# Patient Record
Sex: Female | Born: 1955 | Race: White | Hispanic: No | Marital: Married | Smoking: Former smoker
Health system: Southern US, Community
[De-identification: ages and names within clinical notes are randomized; demographics above are authoritative.]

## PROBLEM LIST (undated history)

## (undated) DIAGNOSIS — M1990 Unspecified osteoarthritis, unspecified site: Secondary | ICD-10-CM

## (undated) DIAGNOSIS — F419 Anxiety disorder, unspecified: Secondary | ICD-10-CM

## (undated) DIAGNOSIS — E785 Hyperlipidemia, unspecified: Secondary | ICD-10-CM

## (undated) DIAGNOSIS — J984 Other disorders of lung: Secondary | ICD-10-CM

## (undated) DIAGNOSIS — F32A Depression, unspecified: Secondary | ICD-10-CM

## (undated) DIAGNOSIS — N6452 Nipple discharge: Secondary | ICD-10-CM

## (undated) DIAGNOSIS — K219 Gastro-esophageal reflux disease without esophagitis: Secondary | ICD-10-CM

## (undated) DIAGNOSIS — F329 Major depressive disorder, single episode, unspecified: Secondary | ICD-10-CM

## (undated) HISTORY — DX: Gastro-esophageal reflux disease without esophagitis: K21.9

## (undated) HISTORY — PX: REPLACEMENT TOTAL KNEE: SUR1224

## (undated) HISTORY — DX: Nipple discharge: N64.52

## (undated) HISTORY — DX: Hyperlipidemia, unspecified: E78.5

## (undated) HISTORY — DX: Depression, unspecified: F32.A

## (undated) HISTORY — DX: Other disorders of lung: J98.4

## (undated) HISTORY — DX: Anxiety disorder, unspecified: F41.9

## (undated) HISTORY — PX: FRACTURE SURGERY: SHX138

## (undated) HISTORY — DX: Unspecified osteoarthritis, unspecified site: M19.90

## (undated) HISTORY — PX: SHOULDER ARTHROSCOPY: SHX128

## (undated) HISTORY — PX: BREAST BIOPSY: SHX20

## (undated) HISTORY — PX: OTHER SURGICAL HISTORY: SHX169

## (undated) HISTORY — PX: TONSILLECTOMY: SUR1361

## (undated) HISTORY — PX: APPENDECTOMY (OPEN): SHX54

## (undated) HISTORY — PX: APPENDECTOMY: SHX54

---

## 2014-08-15 ENCOUNTER — Ambulatory Visit (INDEPENDENT_AMBULATORY_CARE_PROVIDER_SITE_OTHER): Payer: Commercial Managed Care - POS | Admitting: Family

## 2014-08-15 ENCOUNTER — Encounter (INDEPENDENT_AMBULATORY_CARE_PROVIDER_SITE_OTHER): Payer: Self-pay | Admitting: Family

## 2014-08-15 VITALS — BP 124/76 | HR 65 | Temp 96.9°F | Resp 16 | Ht 64.25 in | Wt 206.6 lb

## 2014-08-15 DIAGNOSIS — M797 Fibromyalgia: Secondary | ICD-10-CM

## 2014-08-15 DIAGNOSIS — F329 Major depressive disorder, single episode, unspecified: Secondary | ICD-10-CM

## 2014-08-15 DIAGNOSIS — E559 Vitamin D deficiency, unspecified: Secondary | ICD-10-CM | POA: Insufficient documentation

## 2014-08-15 DIAGNOSIS — F32A Depression, unspecified: Secondary | ICD-10-CM | POA: Insufficient documentation

## 2014-08-15 DIAGNOSIS — E538 Deficiency of other specified B group vitamins: Secondary | ICD-10-CM

## 2014-08-15 DIAGNOSIS — E785 Hyperlipidemia, unspecified: Secondary | ICD-10-CM

## 2014-08-15 MED ORDER — PREGABALIN 100 MG PO CAPS
100.00 mg | ORAL_CAPSULE | Freq: Two times a day (BID) | ORAL | Status: DC
Start: ? — End: 2014-08-15

## 2014-08-15 NOTE — Progress Notes (Signed)
Subjective:    Patient ID: Alicia Barry is a 58 y.o. female.    HPI Comments: Alicia Barry is in today to establish herself with this practice. She moved to [facility] within the past 2 years but has been traveling to [place] for routine medical checkups.    Alicia Barry has a diagnosis of depression which she states is fairly well controlled. She sees a psychiatrist, Dr. Zollie Scale, in [place]. She has recently developed some lower back and left hip pain and sees Dr. Ronalee Red, orthopedist, in [place], [place]. She recently received steroid injections in her spine and hip which did provide some relief. She was given an order for physical therapy but has not scheduled that yet.    Alicia Barry reports a history of fibromyalgia which has been well controlled with Lyrica 100 mg. She takes this once, sometimes twice, daily. She does need a refill of this medication.    She is brought in image of her most recent blood work which was done in August, 2015. She had a Pap smear done which was normal. Cholesterol was fairly well controlled with a slight elevation in LDL. Vitamin D was low but she states that she has started taking vitamin D3 5000 IUs daily. Vitamin B12 was less than 400 but was not advised to supplement. Other labs were within normal limits, including the TSH and free T4. Free T3 was not checked.          Review of Systems   Constitutional: Negative.    Respiratory: Negative.    Cardiovascular: Negative.    Gastrointestinal: Negative.         History of hiatal hernia and reflux. Symptoms are controlled with Protonix. She avoids NSAIDs because of stomach upset and heartburn.   Genitourinary:        Post menopause.   Musculoskeletal: Positive for back pain.   Neurological: Negative.    Psychiatric/Behavioral: Negative for sleep disturbance and dysphoric mood.           Objective:    Physical Exam   Constitutional: She is oriented to person, place, and time. She appears well-developed.    Obese.   HENT:   Head: Normocephalic and atraumatic.   Eyes: Pupils are equal, round, and reactive to light.   Neck: Normal range of motion. Neck supple. No thyromegaly present.   Cardiovascular: Normal rate, regular rhythm and normal heart sounds.    No murmur heard.  Pulmonary/Chest: Effort normal and breath sounds normal.   Musculoskeletal: Normal range of motion.   Lymphadenopathy:     She has no cervical adenopathy.   Neurological: She is alert and oriented to person, place, and time.   Skin: Skin is warm and dry.   Psychiatric: She has a normal mood and affect.   Vitals reviewed.          Assessment:       1. Hyperlipidemia, unspecified hyperlipidemia     2. Depression     3. Fibromyalgia     4. Vitamin D deficiency     5. Vitamin B12 deficiency               Plan:       As previously stated, previous lab work drawn in August 2015 was reviewed with the patient. In addition to the vitamin D that she is currently taking, I recommend that she also add vitamin B12 1000 g daily.    I recommend that blood work be rechecked in one month.  Lyrica 100 mg twice a day refilled.

## 2014-09-06 ENCOUNTER — Ambulatory Visit (INDEPENDENT_AMBULATORY_CARE_PROVIDER_SITE_OTHER): Payer: Commercial Managed Care - POS | Admitting: Family

## 2014-09-06 ENCOUNTER — Encounter (INDEPENDENT_AMBULATORY_CARE_PROVIDER_SITE_OTHER): Payer: Self-pay | Admitting: Family

## 2014-09-06 VITALS — BP 151/82 | HR 66 | Temp 98.6°F | Resp 16 | Ht 64.25 in | Wt 207.4 lb

## 2014-09-06 DIAGNOSIS — J320 Chronic maxillary sinusitis: Secondary | ICD-10-CM

## 2014-09-06 MED ORDER — FLUTICASONE PROPIONATE 50 MCG/ACT NA SUSP
NASAL | Status: DC
Start: ? — End: 2014-09-06

## 2014-09-06 MED ORDER — AMOXICILLIN-POT CLAVULANATE 875-125 MG PO TABS
1.0000 | ORAL_TABLET | Freq: Two times a day (BID) | ORAL | Status: DC
Start: ? — End: 2014-09-06

## 2014-09-06 NOTE — Progress Notes (Signed)
Subjective:    Patient ID: Alicia Barry is a 59 y.o. female.    HPI Comments: Alicia Barry is in today with complaints of headache and congestion which has been going on for about 5 days. She hasn't had much drainage but her throat is irritated. She also has not had a cough. She has been taking over-the-counter Mucinex Max and large doses of vitamin C. She denies ear pain. She has a grandchild that is scheduled to be born next week and wants to make sure that she is not contagious.      Review of Systems   Constitutional: Negative.    HENT: Positive for congestion, postnasal drip, sinus pressure and sore throat.    Respiratory: Negative for cough and shortness of breath.    Neurological: Positive for headaches.           Objective:    Physical Exam   Constitutional: She is oriented to person, place, and time. She appears well-developed. No distress.   Obese.   HENT:   Head: Normocephalic and atraumatic.   Right Ear: External ear and ear canal normal. Tympanic membrane is scarred.   Left Ear: Tympanic membrane, external ear and ear canal normal.   Nose: Mucosal edema present. Right sinus exhibits maxillary sinus tenderness and frontal sinus tenderness. Left sinus exhibits maxillary sinus tenderness and frontal sinus tenderness.   Mouth/Throat: Uvula is midline and mucous membranes are normal. Posterior oropharyngeal erythema present.   Eyes: Pupils are equal, round, and reactive to light.   Neck: Normal range of motion. Neck supple. No thyromegaly present.   Cardiovascular: Normal rate, regular rhythm and normal heart sounds.    No murmur heard.  Pulmonary/Chest: Effort normal and breath sounds normal.   Musculoskeletal: Normal range of motion.   Lymphadenopathy:     She has no cervical adenopathy.   Neurological: She is alert and oriented to person, place, and time.   Skin: Skin is warm and dry.   Psychiatric: She has a normal mood and affect.   Vitals reviewed.          Assessment:       1. Maxillary sinusitis,  unspecified chronicity          Plan:       Rx: Flonase nasal spray 2 sprays in each nostril once or twice daily.    If symptoms worsen or do not significantly improve, Rx: Augmentin 875 mg twice a day with food.  The symptoms would include sick yellow or green sinus drainage or sputum production with the cough or worsening sore throat.    Continue over-the-counter supplements and medications.    Follow-up if needed.

## 2014-10-09 ENCOUNTER — Ambulatory Visit (INDEPENDENT_AMBULATORY_CARE_PROVIDER_SITE_OTHER): Payer: Commercial Managed Care - POS | Admitting: Family

## 2014-10-09 ENCOUNTER — Encounter (INDEPENDENT_AMBULATORY_CARE_PROVIDER_SITE_OTHER): Payer: Self-pay | Admitting: Family

## 2014-10-09 VITALS — BP 113/68 | HR 73 | Temp 97.5°F | Resp 16 | Ht 64.25 in | Wt 205.2 lb

## 2014-10-09 DIAGNOSIS — J019 Acute sinusitis, unspecified: Secondary | ICD-10-CM

## 2014-10-09 MED ORDER — AMOXICILLIN-POT CLAVULANATE 875-125 MG PO TABS
1.0000 | ORAL_TABLET | Freq: Two times a day (BID) | ORAL | Status: DC
Start: ? — End: 2014-10-09

## 2014-10-09 NOTE — Progress Notes (Signed)
Subjective:    Patient ID: Alicia Barry is a 59 y.o. female.    URI   This is a new problem. The current episode started in the past 7 days. There has been no fever. Associated symptoms include congestion, coughing, rhinorrhea and a sore throat. Pertinent negatives include no wheezing.     Was exposed to grandson who tested positive for Strep.      Review of Systems   Constitutional: Negative.    HENT: Positive for congestion, rhinorrhea and sore throat.         Sinus D/C is yellow.   Respiratory: Positive for cough and chest tightness. Negative for shortness of breath and wheezing.            Objective:    Physical Exam   Constitutional: She appears well-developed. No distress.   Overweight, weight is down 2#.   HENT:   Head: Normocephalic and atraumatic.   Right Ear: Tympanic membrane, external ear and ear canal normal.   Left Ear: Tympanic membrane, external ear and ear canal normal.   Nose: Mucosal edema and rhinorrhea present.   Mouth/Throat: Uvula is midline. Posterior oropharyngeal erythema present.   Vitals reviewed.          Assessment:       1. Acute sinusitis, recurrence not specified, unspecified location          Plan:       Rx: Augmentin 875mg  BID with food.    Continue flonase.    Maintain good hydration.

## 2015-01-06 ENCOUNTER — Other Ambulatory Visit (INDEPENDENT_AMBULATORY_CARE_PROVIDER_SITE_OTHER): Payer: Self-pay | Admitting: Family

## 2015-01-06 ENCOUNTER — Ambulatory Visit
Admission: RE | Admit: 2015-01-06 | Discharge: 2015-01-06 | Disposition: A | Discharge status: Home / Self Care | Payer: Commercial Managed Care - POS | Source: Ambulatory Visit | Attending: Family | Admitting: Family

## 2015-01-06 ENCOUNTER — Encounter (INDEPENDENT_AMBULATORY_CARE_PROVIDER_SITE_OTHER): Payer: Self-pay | Admitting: Family

## 2015-01-06 ENCOUNTER — Ambulatory Visit (INDEPENDENT_AMBULATORY_CARE_PROVIDER_SITE_OTHER): Payer: Commercial Managed Care - POS | Admitting: Family

## 2015-01-06 VITALS — BP 129/60 | HR 82 | Temp 97.3°F | Resp 16 | Ht 64.25 in | Wt 204.4 lb

## 2015-01-06 DIAGNOSIS — M79671 Pain in right foot: Secondary | ICD-10-CM

## 2015-01-06 DIAGNOSIS — M25774 Osteophyte, right foot: Secondary | ICD-10-CM | POA: Insufficient documentation

## 2015-01-06 DIAGNOSIS — R937 Abnormal findings on diagnostic imaging of other parts of musculoskeletal system: Secondary | ICD-10-CM | POA: Insufficient documentation

## 2015-01-06 DIAGNOSIS — M7731 Calcaneal spur, right foot: Secondary | ICD-10-CM | POA: Insufficient documentation

## 2015-01-06 MED ORDER — HYDROCODONE-ACETAMINOPHEN 5-325 MG PO TABS
2.0000 | ORAL_TABLET | Freq: Four times a day (QID) | ORAL | Status: DC | PRN
Start: ? — End: 2015-01-06

## 2015-01-06 NOTE — Progress Notes (Addendum)
Subjective:    Patient ID: Alicia Barry is a 59 y.o. female.    Foot Pain  This is a new problem. The current episode started 1 to 4 weeks ago. The problem occurs constantly. The problem has been unchanged. The symptoms are aggravated by walking. She has tried acetaminophen for the symptoms. The treatment provided no relief.     Alicia Barry denies any known specific injury or activity that precipitated her foot pain. He states that she has been walking 2 miles a day for nearly 2 months. Since the pain has occurred over the past 2 weeks, she has been unable to walk. She has been applying ice, elevating the foot and resting with no improvements. She reports the pain most commonly occurring on the dorsal surface of the foot but she also experiences pain on the plantar surface, at the arch. Swelling in the foot and the ankle occur later in the day.        Review of Systems   Constitutional: Positive for activity change.        Unable to walk for exercise.   HENT: Negative.    Respiratory: Negative.    Musculoskeletal:        See history of present illness   Neurological: Negative.    Psychiatric/Behavioral: Negative.            Objective:    Physical Exam   Constitutional: She is oriented to person, place, and time. She appears well-developed. No distress.   Obese, weight is down 1 pound.   HENT:   Head: Normocephalic and atraumatic.   Cardiovascular: Normal rate and regular rhythm.    Pulmonary/Chest: Effort normal and breath sounds normal.   Musculoskeletal: Normal range of motion. She exhibits tenderness.   Examination of the right foot and ankle: A 2.5 cm circular area of soft edema without erythema is noted anterior to the lateral malleolus. A 1.5 cm circular area of soft edema without erythema is noted at the center of the dorsal surface of the foot. Tenderness is reported with palpation along the dorsum and point tenderness at the proximal aspect of the plantar arch. There is mild pain with dorsal flexion, more  pain with dorsal extension. No impaired range of motion in the toes or ankle. No diffuse edema at this time.   Neurological: She is alert and oriented to person, place, and time.   Skin: Skin is warm and dry.   Psychiatric: She has a normal mood and affect.   Vitals reviewed.          Assessment:       1. Right foot pain          Plan:       The foot was wrapped with Coban.    An x-ray of the right foot is ordered.    Alicia Barry is allergic to ibuprofen. She states that Percocet causes itching and is not sure how she reacts to Vicodin. Rx: Hydrocodone 5/325 mg prescribed. She is instructed to give me a call if she has any problems with this medication.    Although she is having ankle and dorsal foot pain, plantar fasciitis may also be contributing to her symptoms. Printed information regarding plantar fasciitis as well as exercises to help with the condition were provided.    I recommend that she also try a shoe insert that provides heel and arch support.    Follow-up by phone after results of the x-ray have been reviewed.  Consider referrals to or so or physical therapy as indicated.        I have reviewed and I am in agreement.    Evangeline Gula M.D

## 2015-08-07 ENCOUNTER — Other Ambulatory Visit
Admission: RE | Admit: 2015-08-07 | Discharge: 2015-08-07 | Disposition: A | Discharge status: Home / Self Care | Payer: Commercial Managed Care - POS | Source: Ambulatory Visit | Attending: Family | Admitting: Family

## 2015-08-07 ENCOUNTER — Encounter (RURAL_HEALTH_CENTER): Payer: Self-pay | Admitting: Family

## 2015-08-07 ENCOUNTER — Ambulatory Visit (RURAL_HEALTH_CENTER): Payer: Commercial Managed Care - POS | Admitting: Family

## 2015-08-07 VITALS — BP 129/53 | HR 68 | Temp 98.2°F | Resp 14 | Ht 65.0 in | Wt 216.4 lb

## 2015-08-07 DIAGNOSIS — E538 Deficiency of other specified B group vitamins: Secondary | ICD-10-CM

## 2015-08-07 DIAGNOSIS — R7309 Other abnormal glucose: Secondary | ICD-10-CM | POA: Insufficient documentation

## 2015-08-07 DIAGNOSIS — E559 Vitamin D deficiency, unspecified: Secondary | ICD-10-CM

## 2015-08-07 DIAGNOSIS — E782 Mixed hyperlipidemia: Secondary | ICD-10-CM

## 2015-08-07 DIAGNOSIS — Z13820 Encounter for screening for osteoporosis: Secondary | ICD-10-CM

## 2015-08-07 DIAGNOSIS — Z23 Encounter for immunization: Secondary | ICD-10-CM

## 2015-08-07 DIAGNOSIS — Z1239 Encounter for other screening for malignant neoplasm of breast: Secondary | ICD-10-CM

## 2015-08-07 DIAGNOSIS — M797 Fibromyalgia: Secondary | ICD-10-CM

## 2015-08-07 LAB — COMPREHENSIVE METABOLIC PANEL
ALT: 11 U/L (ref 0–55)
AST (SGOT): 16 U/L (ref 10–42)
Albumin/Globulin Ratio: 1.19 Ratio (ref 0.70–1.50)
Albumin: 3.7 gm/dL (ref 3.5–5.0)
Alkaline Phosphatase: 82 U/L (ref 40–145)
Anion Gap: 14.5 mMol/L (ref 7.0–18.0)
BUN / Creatinine Ratio: 15.7 Ratio (ref 10.0–30.0)
BUN: 13 mg/dL (ref 7–22)
Bilirubin, Total: 0.6 mg/dL (ref 0.1–1.2)
CO2: 25 mMol/L (ref 20.0–30.0)
Calcium: 8.9 mg/dL (ref 8.5–10.5)
Chloride: 104 mMol/L (ref 98–110)
Creatinine: 0.83 mg/dL (ref 0.60–1.20)
EGFR: >60 mL/min/{1.73_m2}
Globulin: 3.1 gm/dL (ref 2.0–4.0)
Glucose: 103 mg/dL — ABNORMAL HIGH (ref 70–99)
Osmolality Calc: 278 mOsm/kg (ref 275–300)
Potassium: 4.5 mMol/L (ref 3.5–5.3)
Protein, Total: 6.8 gm/dL (ref 6.0–8.3)
Sodium: 139 mMol/L (ref 136–147)

## 2015-08-07 LAB — CBC AND DIFFERENTIAL
Basophils %: 1.2 % (ref 0.0–3.0)
Basophils Absolute: 0.1 10*3/uL (ref 0.0–0.3)
Eosinophils %: 2.4 % (ref 0.0–7.0)
Eosinophils Absolute: 0.2 10*3/uL (ref 0.0–0.8)
Hematocrit: 48.5 % — ABNORMAL HIGH (ref 36.0–48.0)
Hemoglobin: 15.9 gm/dL (ref 12.0–16.0)
Lymphocytes Absolute: 2.4 10*3/uL (ref 0.6–5.1)
Lymphocytes: 31.9 % (ref 15.0–46.0)
MCH: 33 pg (ref 28–35)
MCHC: 33 gm/dL (ref 32–36)
MCV: 99 fL (ref 80–100)
MPV: 8.6 fL (ref 6.0–10.0)
Monocytes Absolute: 0.4 10*3/uL (ref 0.1–1.7)
Monocytes: 4.9 % (ref 3.0–15.0)
Neutrophils %: 59.6 % (ref 42.0–78.0)
Neutrophils Absolute: 4.5 10*3/uL (ref 1.7–8.6)
PLT CT: 244 10*3/uL (ref 130–440)
RBC: 4.89 10*6/uL (ref 3.80–5.00)
RDW: 12.2 % (ref 11.0–14.0)
WBC: 7.5 10*3/uL (ref 4.0–11.0)

## 2015-08-07 LAB — VITAMIN B12 AND FOLATE
Folate: 4.7 ng/mL — ABNORMAL LOW (ref 7.0–19.9)
Vitamin B-12: 580 pg/mL (ref 213–816)

## 2015-08-07 LAB — LIPID PANEL
Cholesterol: 209 mg/dL — ABNORMAL HIGH (ref 75–199)
Coronary Heart Disease Risk: 4.45
HDL: 47 mg/dL (ref 45–65)
LDL Calculated: 146 mg/dL
Triglycerides: 78 mg/dL (ref 10–150)
VLDL: 16 (ref 0–40)

## 2015-08-07 LAB — VITAMIN D,25 OH,TOTAL: Vitamin D 25-Hydroxy: 27 ng/mL — ABNORMAL LOW (ref 30–80)

## 2015-08-07 MED ORDER — SIMVASTATIN 20 MG PO TABS
20.0000 mg | ORAL_TABLET | Freq: Every evening | ORAL | Status: DC
Start: 2015-08-07 — End: 2016-07-14

## 2015-08-07 MED ORDER — PREGABALIN 100 MG PO CAPS
100.0000 mg | ORAL_CAPSULE | Freq: Two times a day (BID) | ORAL | Status: DC
Start: 2015-08-07 — End: 2016-07-27

## 2015-08-07 NOTE — Progress Notes (Signed)
Administered flu injection to right deltoid. Provider present. Patient tolerated procedure well and voiced no complaints. //TK/MA

## 2015-08-07 NOTE — Progress Notes (Signed)
Subjective:    Patient ID: Alicia Barry is a 59 y.o. female.    HPI Comments: Alicia Barry is in today for a follow-up on her cholesterol and is fasting for blood work. She has a history of vitamin D and vitamin B12 deficiency and admits that she is not regular about taking the supplementation on a daily basis.    She has a history of depression and anxiety which she states is well controlled. She sees a psychiatrist, Dr. Zollie Scale, who manages her medications. She occasionally has trouble sleeping and was prescribed Seroquel 25 mg which she uses as needed, she states this is rare    She has a history of chronic back pain and fibromyalgia. She states that her pain has been minimal and she is taking the Lyrica 100 mg once a day.    She also has a history of GERD which is controlled with her Protonix 40 mg.          Review of Systems   Constitutional: Negative.    HENT: Negative.    Respiratory: Negative.    Cardiovascular: Negative.    Gastrointestinal: Negative.    Endocrine: Negative.    Genitourinary:        [name] is up-to-date on her Pap smear but states that it has been several years since she had a mammogram, nearly 6 years since her bone density test. She reports a history of very dense rest tissue but has never had any concerning findings on mammogram or ultrasound.   Neurological: Negative.    Psychiatric/Behavioral: Negative.            Objective:    Physical Exam   Constitutional: She is oriented to person, place, and time. She appears well-developed and well-nourished. No distress.   HENT:   Head: Normocephalic and atraumatic.   Eyes: Pupils are equal, round, and reactive to light.   Neck: Normal range of motion. Neck supple. No thyromegaly present.   Cardiovascular: Normal rate, regular rhythm and normal heart sounds.    No murmur heard.  Pulmonary/Chest: Effort normal and breath sounds normal. No respiratory distress.   Musculoskeletal: Normal range of motion.   Lymphadenopathy:     She has no  cervical adenopathy.   Neurological: She is alert and oriented to person, place, and time.   Skin: Skin is warm and dry.   Psychiatric: She has a normal mood and affect.   Vitals reviewed.          Assessment:       1. Mixed hyperlipidemia    2. Vitamin D deficiency    3. Vitamin B12 deficiency    4. Breast cancer screening    5. Osteoporosis screening    6. Fibromyalgia    7. Influenza vaccination administered at current visit          Plan:       Fasting blood work today will include a CBC, CMP, lipid panel, vitamin D 25-hydroxy, vitamin B 12/folate.    Orders for a screening mammogram and bilateral breast ultrasound if indicated were provided. An order for a bone density test was also divided. These will both be done at [facility].    Refills of her Lyrica 100 mg and simvastatin 20 mg were ordered through her mail order pharmacy.    I will follow-up with Steward Drone via my chart regarding her lab results. She understands that we may need to change the dose of the simvastatin if her cholesterol is  elevated.    I will plan on seeing her back in the office in 6 months for a follow-up, sooner if needed.    A flu shot was administered today.

## 2015-08-08 ENCOUNTER — Other Ambulatory Visit (RURAL_HEALTH_CENTER): Payer: Self-pay | Admitting: Family

## 2015-08-08 DIAGNOSIS — R7309 Other abnormal glucose: Secondary | ICD-10-CM

## 2015-08-08 DIAGNOSIS — E538 Deficiency of other specified B group vitamins: Secondary | ICD-10-CM | POA: Insufficient documentation

## 2015-08-08 LAB — HEMOGLOBIN A1C: Hgb A1C, %: 5.6 %

## 2015-08-08 MED ORDER — FOLIC ACID 1 MG PO TABS
1.0000 mg | ORAL_TABLET | Freq: Every day | ORAL | Status: DC
Start: 2015-08-08 — End: 2016-02-04

## 2015-08-11 ENCOUNTER — Telehealth (RURAL_HEALTH_CENTER): Payer: Self-pay

## 2015-08-11 ENCOUNTER — Encounter (RURAL_HEALTH_CENTER): Payer: Self-pay

## 2015-08-20 ENCOUNTER — Ambulatory Visit: Payer: Commercial Managed Care - POS

## 2015-08-20 ENCOUNTER — Other Ambulatory Visit (RURAL_HEALTH_CENTER): Payer: Self-pay | Admitting: Family

## 2015-08-20 ENCOUNTER — Ambulatory Visit
Admission: RE | Admit: 2015-08-20 | Discharge: 2015-08-20 | Disposition: A | Discharge status: Home / Self Care | Payer: Commercial Managed Care - POS | Source: Ambulatory Visit | Attending: Family | Admitting: Family

## 2015-08-20 DIAGNOSIS — Z780 Asymptomatic menopausal state: Secondary | ICD-10-CM | POA: Insufficient documentation

## 2015-08-20 DIAGNOSIS — Z13820 Encounter for screening for osteoporosis: Secondary | ICD-10-CM | POA: Insufficient documentation

## 2015-08-20 DIAGNOSIS — Z1239 Encounter for other screening for malignant neoplasm of breast: Secondary | ICD-10-CM

## 2015-08-20 DIAGNOSIS — N6002 Solitary cyst of left breast: Secondary | ICD-10-CM | POA: Insufficient documentation

## 2015-08-20 DIAGNOSIS — Z1231 Encounter for screening mammogram for malignant neoplasm of breast: Secondary | ICD-10-CM | POA: Insufficient documentation

## 2015-08-22 NOTE — Telephone Encounter (Signed)
error 

## 2015-10-15 ENCOUNTER — Ambulatory Visit (RURAL_HEALTH_CENTER): Payer: Commercial Managed Care - POS | Admitting: Family

## 2015-10-15 ENCOUNTER — Encounter (RURAL_HEALTH_CENTER): Payer: Self-pay | Admitting: Family

## 2015-10-15 VITALS — BP 141/67 | HR 78 | Temp 98.4°F | Resp 16 | Ht 65.0 in | Wt 215.6 lb

## 2015-10-15 DIAGNOSIS — M5441 Lumbago with sciatica, right side: Secondary | ICD-10-CM

## 2015-10-15 MED ORDER — PREDNISONE 20 MG PO TABS
ORAL_TABLET | ORAL | Status: DC
Start: 2015-10-15 — End: 2016-02-25

## 2015-10-15 MED ORDER — METHOCARBAMOL 500 MG PO TABS
500.0000 mg | ORAL_TABLET | Freq: Four times a day (QID) | ORAL | Status: DC
Start: 2015-10-15 — End: 2016-02-25

## 2015-10-15 NOTE — Progress Notes (Signed)
Subjective:    Patient ID: Alicia Barry is a 60 y.o. female.    HPI Comments: Alicia Barry is in today with complaints of lower back pain that started yesterday morning when she got out of bed. She denies any unusual precipitating activity. She describes her pain more like muscle spasms with pain radiating down the right leg. She is most comfortable when she is sitting on a heating pad or lying down.    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 She has been seeing an orthopedist in [place] but is in the process of switching her care to [facility] orthopedics.          Review of Systems   Constitutional: Negative.    HENT: Negative.    Respiratory: Negative.    Gastrointestinal: Negative.    Genitourinary: Negative.    Musculoskeletal: Positive for back pain.   Psychiatric/Behavioral: Negative.            Objective:    Physical Exam   Constitutional: She is oriented to person, place, and time. She appears well-developed and well-nourished. No distress.   HENT:   Head: Normocephalic and atraumatic.   Eyes: EOM are normal. Pupils are equal, round, and reactive to light.   Cardiovascular: Normal rate, regular rhythm and normal heart sounds.    No murmur heard.  Pulmonary/Chest: Effort normal and breath sounds normal. No respiratory distress. She has no wheezes.   Musculoskeletal:   Tenderness reported with palpation of the lumbar and sacral spine as well as the right SI joint. Positive right leg raise, negative dorsiflexion bilaterally.   Neurological: She is alert and oriented to person, place, and time.   Skin: Skin is warm and dry.   Psychiatric: She has a normal mood and affect.   Vitals reviewed.          Assessment:       1. Acute right-sided low back pain with right-sided sciatica          Plan:       Rx: Prednisone 20 mg, tapering from 60 mg to 10 mg over 8 days. Potential side effects discussed.    Rx: Robaxin 500 mg every 6 hours as needed. Potential drowsiness  discussed.    I recommend gentle stretching exercises for the lower back and pelvis.    Follow-up as needed.

## 2016-01-16 ENCOUNTER — Other Ambulatory Visit (RURAL_HEALTH_CENTER): Payer: Self-pay

## 2016-01-16 MED ORDER — PANTOPRAZOLE SODIUM 40 MG PO TBEC
40.0000 mg | DELAYED_RELEASE_TABLET | Freq: Every day | ORAL | Status: DC
Start: 2016-01-16 — End: 2017-01-24

## 2016-01-16 NOTE — Telephone Encounter (Signed)
Previous provider prescribed last time. Please send to [facility] //TK/MA

## 2016-02-02 HISTORY — PX: ANKLE SURGERY: SHX546

## 2016-02-04 ENCOUNTER — Other Ambulatory Visit (RURAL_HEALTH_CENTER): Payer: Self-pay | Admitting: Family

## 2016-02-25 ENCOUNTER — Encounter (RURAL_HEALTH_CENTER): Payer: Self-pay | Admitting: Family

## 2016-02-25 ENCOUNTER — Ambulatory Visit (RURAL_HEALTH_CENTER): Payer: Commercial Managed Care - POS | Admitting: Family

## 2016-02-25 VITALS — BP 138/78 | HR 69 | Temp 97.8°F | Resp 18 | Wt 216.4 lb

## 2016-02-25 DIAGNOSIS — R42 Dizziness and giddiness: Secondary | ICD-10-CM

## 2016-02-25 MED ORDER — FLUTICASONE PROPIONATE 50 MCG/ACT NA SUSP
NASAL | Status: DC
Start: 2016-02-25 — End: 2016-12-08

## 2016-02-25 MED ORDER — MECLIZINE HCL 25 MG PO TABS
25.0000 mg | ORAL_TABLET | Freq: Three times a day (TID) | ORAL | Status: DC | PRN
Start: 2016-02-25 — End: 2016-11-10

## 2016-02-25 NOTE — Progress Notes (Signed)
Subjective:    Patient ID: Alicia Barry is a 60 y.o. female.    HPI Comments: Alicia Barry presents to the clinic today with a chief complaint of dizziness.     She reports that the dizziness began 2 days ago when she woke up from sleeping. She reports falling on Monday as a result of this sudden dizziness. She reports that it is worse when she changes position, whether it be lying to sitting or even just rolling over in bed. The dizziness is intermittent and comes and goes throughout the day. She reports associated nausea and even had one episode of non-bilious non-bloody vomiting 5 hours ago as a result. The dizziness can last up to an hour and is followed by a feeling of "heaviness in her head". She also reports bilateral ear pressure, congestion, and a frontal pressure headache that feels like a "band is wrapped around her head". These headaches only last for a minute or two and she has not taken any medications to help with her symptoms. She denies fever, cough, and nasal drainage. She occasionally gains relief with lying down but the relief is minimal. Her mother has a history of vertigo and died from a stroke. She is very "scared" that something like this is going to happen to her. She just wants to get checked out to make sure she is ok. (see more below)      Dizziness  This is a new problem. The current episode started in the past 7 days. The problem occurs intermittently. The problem has been gradually worsening. Associated symptoms include congestion, headaches, nausea, vertigo and vomiting. Pertinent negatives include no abdominal pain, chest pain, coughing, fatigue, fever, neck pain, sore throat or weakness. The symptoms are aggravated by intercourse, standing and bending. She has tried lying down and position changes for the symptoms. The treatment provided mild relief.           Review of Systems   Constitutional: Negative for fever, activity change and fatigue.   HENT: Positive for congestion and  sinus pressure. Negative for ear discharge, postnasal drip, rhinorrhea, sore throat, tinnitus and trouble swallowing.         Bilateral ear pressure, sinus headaches   Eyes: Negative for pain and itching.   Respiratory: Negative for apnea, cough, chest tightness, shortness of breath and wheezing.    Cardiovascular: Negative for chest pain, palpitations and leg swelling.   Gastrointestinal: Positive for nausea and vomiting. Negative for abdominal pain, diarrhea and constipation.   Musculoskeletal: Negative for neck pain and neck stiffness.   Skin: Negative.    Neurological: Positive for dizziness, vertigo, light-headedness and headaches. Negative for weakness.        Fell secondary to dizziness 2 days ago   Hematological: Negative for adenopathy.   Psychiatric/Behavioral: Negative.            Objective:    Physical Exam   Constitutional: She is oriented to person, place, and time. She appears well-developed and well-nourished. No distress.   HENT:   Head: Normocephalic and atraumatic.   Right Ear: Hearing, tympanic membrane, external ear and ear canal normal. Tympanic membrane is not perforated, not erythematous, not retracted and not bulging. No middle ear effusion.   Left Ear: Hearing, tympanic membrane, external ear and ear canal normal. Tympanic membrane is not perforated, not erythematous, not retracted and not bulging.  No middle ear effusion.   Nose: Right sinus exhibits frontal sinus tenderness. Right sinus exhibits no maxillary sinus tenderness. Left  sinus exhibits frontal sinus tenderness. Left sinus exhibits no maxillary sinus tenderness.   Mouth/Throat: Uvula is midline, oropharynx is clear and moist and mucous membranes are normal. No oropharyngeal exudate or posterior oropharyngeal erythema.   Neck: Normal range of motion. Neck supple. No thyromegaly present.   No carotid bruits present bilaterally    Cardiovascular: Normal rate, regular rhythm and normal heart sounds.    No murmur  heard.  Pulmonary/Chest: Effort normal and breath sounds normal. No respiratory distress. She has no wheezes. She exhibits no tenderness.   Abdominal: Soft. Bowel sounds are normal. She exhibits no distension. There is no tenderness.   Musculoskeletal: Normal range of motion. She exhibits no edema or tenderness.   Lymphadenopathy:     She has no cervical adenopathy.   Neurological: She is alert and oriented to person, place, and time.   Skin: Skin is warm and dry.   Psychiatric: She has a normal mood and affect. Her behavior is normal. Judgment and thought content normal.   Vitals reviewed.          Assessment:       1. Vertigo          Plan:       1. Vertigo: Prescribed Meclizine 25mg  to take PRN for dizziness. She is also encouraged to begin using Flonase daily to help relieve congestion. Patient instructed to follow-up if the patient develops focal headaches, green nasal drainage, or a productive cough. Would consider starting antibiotics and/or getting a carotid ultrasound at that time.     Follow-up PRN if symptoms are not improved or worsen.           Examination and documentation performed by Iva Lento, FNP student.    I have discussed the findings and treatment plan with the patient and agree with this documentation.  [name] FNP

## 2016-02-26 ENCOUNTER — Encounter (RURAL_HEALTH_CENTER): Payer: Self-pay | Admitting: Family

## 2016-05-15 ENCOUNTER — Other Ambulatory Visit (RURAL_HEALTH_CENTER): Payer: Self-pay | Admitting: Family

## 2016-07-14 ENCOUNTER — Other Ambulatory Visit (RURAL_HEALTH_CENTER): Payer: Self-pay | Admitting: Family

## 2016-07-14 DIAGNOSIS — E782 Mixed hyperlipidemia: Secondary | ICD-10-CM

## 2016-07-14 NOTE — Telephone Encounter (Signed)
Please notify patient that she is due for a routine checkup and fasting blood work.

## 2016-07-14 NOTE — Telephone Encounter (Signed)
Patient is aware. This nurse attempted to schedule an appointment today while on the phone with the patient and the patient voiced that she was unable to schedule and would be calling back after Thanksgiving to schedule an appointment. [facility] LPN

## 2016-07-27 ENCOUNTER — Encounter (RURAL_HEALTH_CENTER): Payer: Self-pay | Admitting: Family Medicine

## 2016-07-27 ENCOUNTER — Ambulatory Visit (RURAL_HEALTH_CENTER): Payer: Commercial Managed Care - POS | Admitting: Family Medicine

## 2016-07-27 VITALS — BP 125/57 | HR 97 | Temp 98.4°F | Resp 19 | Ht 65.0 in | Wt 207.0 lb

## 2016-07-27 DIAGNOSIS — J0100 Acute maxillary sinusitis, unspecified: Secondary | ICD-10-CM

## 2016-07-27 DIAGNOSIS — J45909 Unspecified asthma, uncomplicated: Secondary | ICD-10-CM

## 2016-07-27 MED ORDER — AMOXICILLIN-POT CLAVULANATE 875-125 MG PO TABS
1.0000 | ORAL_TABLET | Freq: Two times a day (BID) | ORAL | 0 refills | Status: AC
Start: 2016-07-27 — End: 2016-08-06

## 2016-07-27 MED ORDER — PREDNISONE 20 MG PO TABS
20.0000 mg | ORAL_TABLET | Freq: Two times a day (BID) | ORAL | 0 refills | Status: DC
Start: 2016-07-27 — End: 2016-08-27

## 2016-07-27 MED ORDER — GUAIFENESIN-CODEINE 100-10 MG/5ML PO SYRP
5.0000 mL | ORAL_SOLUTION | Freq: Three times a day (TID) | ORAL | 0 refills | Status: DC | PRN
Start: 2016-07-27 — End: 2016-08-27

## 2016-07-27 NOTE — Progress Notes (Signed)
Subjective:    Patient ID: Alicia Barry is a 60 y.o. female.    HPI Corah has been sick for 4 or 5 days. She complains of a lot of sinus pressure and congestion over the right maxillary sinus. She's got some discomfort in her left ear and a sore throat. She's also developed coughing with congestion in her chest and wheezing. There's been no fever or chills. She has a remote history of smoking quitting many years ago. Apparently she's had some breathing issues with the need to occasionally use an albuterol inhaler in the past. She denies any GI or GU symptoms.    The following portions of the patient's history were reviewed and updated as appropriate: allergies, current medications, past family history, past medical history, past social history, past surgical history and problem list.    Review of Systems   All other systems reviewed and are negative.          Objective:    Physical Exam   Constitutional: She is oriented to person, place, and time. She appears well-developed. No distress.   HENT:   Head: Normocephalic and atraumatic.   Left Ear: External ear normal.   Nose: Nose normal.   Mouth/Throat: Oropharynx is clear and moist. No oropharyngeal exudate.   Right TM appears dull with obscuration of the landmarks. She has a red posterior pharynx. Mucous membranes hydrated. There is some maxillary tenderness on the right side.   Eyes: Conjunctivae and EOM are normal. Pupils are equal, round, and reactive to light. Right eye exhibits no discharge. Left eye exhibits no discharge. No scleral icterus.   Neck: Normal range of motion. Neck supple. No JVD present. No tracheal deviation present. No thyromegaly present.   Cardiovascular: Normal rate, regular rhythm, normal heart sounds and intact distal pulses.  Exam reveals no gallop and no friction rub.    No murmur heard.  Pulmonary/Chest: Effort normal. No stridor. No respiratory distress. She has wheezes. She has no rales. She exhibits no tenderness.   Patient  has a dry nonproductive cough. There are some scattered expiratory wheezes. Good air movement no respiratory distress.   Abdominal: Soft. Bowel sounds are normal. She exhibits no distension and no mass. There is no tenderness. There is no rebound and no guarding.   Musculoskeletal: Normal range of motion. She exhibits no edema or tenderness.   Lymphadenopathy:     She has no cervical adenopathy.   Neurological: She is alert and oriented to person, place, and time. She has normal reflexes. No cranial nerve deficit. Coordination normal.   Skin: Skin is warm and dry. No rash noted. She is not diaphoretic. No erythema. No pallor.   Psychiatric: She has a normal mood and affect. Her behavior is normal. Judgment and thought content normal.   Nursing note and vitals reviewed.          Assessment:       1. Acute asthmatic bronchitis    2. Acute non-recurrent maxillary sinusitis          Plan:       1. Symptomatic measures discussed. She is to rest and drink plenty of fluids and liquids.  2. Robitussin-AC 1 teaspoon by mouth every 6-8 hours if needed for coughing. Precautions explained.  3. Prednisone 20 mg twice a day for 5 days  4. Augmentin 875 one by mouth twice a day with food for 10 days  5. Patient does have an albuterol inhaler that she can use as needed.  6. Patient to follow-up with me later this week or early next week for persistent symptoms or she should worsen.

## 2016-08-05 ENCOUNTER — Other Ambulatory Visit (RURAL_HEALTH_CENTER): Payer: Self-pay | Admitting: Family

## 2016-08-05 ENCOUNTER — Other Ambulatory Visit
Admission: RE | Admit: 2016-08-05 | Discharge: 2016-08-05 | Disposition: A | Discharge status: Home / Self Care | Payer: Commercial Managed Care - POS | Source: Ambulatory Visit | Attending: Family | Admitting: Family

## 2016-08-05 ENCOUNTER — Ambulatory Visit (RURAL_HEALTH_CENTER): Payer: Commercial Managed Care - POS

## 2016-08-05 DIAGNOSIS — R7309 Other abnormal glucose: Secondary | ICD-10-CM | POA: Insufficient documentation

## 2016-08-05 DIAGNOSIS — E559 Vitamin D deficiency, unspecified: Secondary | ICD-10-CM

## 2016-08-05 DIAGNOSIS — E782 Mixed hyperlipidemia: Secondary | ICD-10-CM

## 2016-08-05 DIAGNOSIS — E538 Deficiency of other specified B group vitamins: Secondary | ICD-10-CM

## 2016-08-05 DIAGNOSIS — M797 Fibromyalgia: Secondary | ICD-10-CM

## 2016-08-05 LAB — CBC AND DIFFERENTIAL
Basophils %: 1.2 % (ref 0.0–3.0)
Basophils Absolute: 0.1 10*3/uL (ref 0.0–0.3)
Eosinophils %: 1.7 % (ref 0.0–7.0)
Eosinophils Absolute: 0.2 10*3/uL (ref 0.0–0.8)
Hematocrit: 50.4 % — ABNORMAL HIGH (ref 36.0–48.0)
Hemoglobin: 16.1 gm/dL — ABNORMAL HIGH (ref 12.0–16.0)
Lymphocytes Absolute: 3.6 10*3/uL (ref 0.6–5.1)
Lymphocytes: 39.1 % (ref 15.0–46.0)
MCH: 32 pg (ref 28–35)
MCHC: 32 gm/dL (ref 32–36)
MCV: 100 fL (ref 80–100)
MPV: 8.1 fL (ref 6.0–10.0)
Monocytes Absolute: 0.5 10*3/uL (ref 0.1–1.7)
Monocytes: 5.5 % (ref 3.0–15.0)
Neutrophils %: 52.4 % (ref 42.0–78.0)
Neutrophils Absolute: 4.8 10*3/uL (ref 1.7–8.6)
PLT CT: 273 10*3/uL (ref 130–440)
RBC: 5.05 10*6/uL — ABNORMAL HIGH (ref 3.80–5.00)
RDW: 12.3 % (ref 11.0–14.0)
WBC: 9.2 10*3/uL (ref 4.0–11.0)

## 2016-08-05 LAB — COMPREHENSIVE METABOLIC PANEL
ALT: 16 U/L (ref 0–55)
AST (SGOT): 13 U/L (ref 10–42)
Albumin/Globulin Ratio: 1.13 Ratio (ref 0.70–1.50)
Albumin: 3.4 gm/dL — ABNORMAL LOW (ref 3.5–5.0)
Alkaline Phosphatase: 85 U/L (ref 40–145)
Anion Gap: 14.1 mMol/L (ref 7.0–18.0)
BUN / Creatinine Ratio: 17.3 Ratio (ref 10.0–30.0)
BUN: 14 mg/dL (ref 7–22)
Bilirubin, Total: 0.8 mg/dL (ref 0.1–1.2)
CO2: 28.7 mMol/L (ref 20.0–30.0)
Calcium: 8.7 mg/dL (ref 8.5–10.5)
Chloride: 102 mMol/L (ref 98–110)
Creatinine: 0.81 mg/dL (ref 0.60–1.20)
EGFR: 79 mL/min/{1.73_m2} (ref 60–150)
Globulin: 3 gm/dL (ref 2.0–4.0)
Glucose: 97 mg/dL (ref 70–99)
Osmolality Calc: 280 mOsm/kg (ref 275–300)
Potassium: 4.8 mMol/L (ref 3.5–5.3)
Protein, Total: 6.4 gm/dL (ref 6.0–8.3)
Sodium: 140 mMol/L (ref 136–147)

## 2016-08-05 LAB — LIPID PANEL
Cholesterol: 191 mg/dL (ref 75–199)
Coronary Heart Disease Risk: 4.55
HDL: 42 mg/dL — ABNORMAL LOW (ref 45–65)
LDL Calculated: 128 mg/dL
Triglycerides: 106 mg/dL (ref 10–150)
VLDL: 21 (ref 0–40)

## 2016-08-05 LAB — VITAMIN B12: Vitamin B-12: 703 pg/mL (ref 213–816)

## 2016-08-05 LAB — T4, FREE: T4 Free: 0.9 ng/dL (ref 0.70–1.48)

## 2016-08-05 LAB — FOLATE: Folate: 15.8 ng/mL (ref 7.0–19.9)

## 2016-08-05 LAB — TSH: TSH: 2.49 u[IU]/mL (ref 0.40–4.20)

## 2016-08-05 LAB — HEMOGLOBIN A1C: Hgb A1C, %: 5.5 %

## 2016-08-05 LAB — T3, FREE: T3, Free: 2.8 pg/mL (ref 1.71–3.71)

## 2016-08-05 NOTE — Progress Notes (Signed)
Date Specimen Drawn:  08/05/2016   Time Specimen Drawn:  9:20AM   Test(s) Ordered:  T3, T4, TSH, Lipid Panel, Vitamin B12, Vitamin D, Hemoglobin A1C, Folate, CMP and CBC   Disposition:  n/a   Patient's Tolerance:  Good   Location Specimen Drawn:  Left antecubital

## 2016-08-06 LAB — VITAMIN D,25 OH,TOTAL: Vitamin D 25-Hydroxy: 22 ng/mL — ABNORMAL LOW (ref 30–80)

## 2016-08-27 ENCOUNTER — Encounter (RURAL_HEALTH_CENTER): Payer: Self-pay | Admitting: Family

## 2016-08-27 ENCOUNTER — Ambulatory Visit (RURAL_HEALTH_CENTER): Payer: Commercial Managed Care - POS | Admitting: Family

## 2016-08-27 VITALS — BP 149/82 | HR 87 | Temp 98.3°F | Resp 18 | Wt 200.6 lb

## 2016-08-27 DIAGNOSIS — R05 Cough: Secondary | ICD-10-CM

## 2016-08-27 DIAGNOSIS — R059 Cough, unspecified: Secondary | ICD-10-CM

## 2016-08-27 MED ORDER — MONTELUKAST SODIUM 10 MG PO TABS
10.0000 mg | ORAL_TABLET | Freq: Every evening | ORAL | 0 refills | Status: DC
Start: 2016-08-27 — End: 2016-09-29

## 2016-08-27 NOTE — Progress Notes (Signed)
Subjective:    Patient ID: Alicia Barry is a 60 y.o. female.    Alicia Barry is in today with complaints of a chronic cough which has been going on for nearly 6 months. She was treated with Augmentin, prednisone and Robitussin with codeine 1 month ago for an acute sinusitis and bronchitis. Despite the treatment, the cough persists. She denies any shortness of breath but sometimes feels as though she is wheezing. The cough is occasionally productive of a white foamy sputum.    Alicia Barry states that she was a 2 pack-a-day smoker for nearly 30 years but quit in 2003.        Review of Systems   Constitutional: Negative.    HENT: Negative.    Respiratory: Positive for cough and wheezing. Negative for chest tightness and shortness of breath.    Cardiovascular: Negative.            Objective:    Physical Exam   Constitutional: She is oriented to person, place, and time. She appears well-developed and well-nourished.   Weight is down 7 pounds since her last visit, 16 pounds over the past 6 months.   HENT:   Head: Normocephalic and atraumatic.   Eyes: EOM are normal. Pupils are equal, round, and reactive to light.   Neck: Normal range of motion. Neck supple.   Cardiovascular: Normal rate, regular rhythm and normal heart sounds.    No murmur heard.  Pulmonary/Chest: Effort normal. No respiratory distress. She has rales (Rubs are noted throughout the lungs bilaterally upon initial auscultation. With cough left (he wet),, lung sounds clear.).   Musculoskeletal: Normal range of motion.   Neurological: She is alert and oriented to person, place, and time.   Skin: Skin is warm and dry.   Psychiatric: She has a normal mood and affect. Her behavior is normal. Judgment and thought content normal.   Vitals reviewed.          Assessment:       1. Cough          Plan:       Chest x-ray ordered. Alicia Barry will have this done at [facility] tomorrow.    Rx: Singulair 10 mg daily.    Follow-up by phone or email regarding chest x-ray  results. If signs of COPD are noted on x-ray, I will try her on Symbicort.    Liam Graham FNP    This note was completed using Dragon Medical speech recognition software. Grammatical errors, random word insertions, pronoun errors and incomplete sentences are occasional consequences of this technology due to software limitations. If there are questions or concerns about the content of this note or information contained within the body of this dictation they should be addressed with the provider for clarification.

## 2016-08-28 ENCOUNTER — Ambulatory Visit
Admission: RE | Admit: 2016-08-28 | Discharge: 2016-08-28 | Disposition: A | Discharge status: Home / Self Care | Payer: Commercial Managed Care - POS | Source: Ambulatory Visit | Attending: Family | Admitting: Family

## 2016-08-28 DIAGNOSIS — R05 Cough: Secondary | ICD-10-CM | POA: Insufficient documentation

## 2016-08-28 DIAGNOSIS — R059 Cough, unspecified: Secondary | ICD-10-CM

## 2016-09-03 ENCOUNTER — Telehealth (RURAL_HEALTH_CENTER): Payer: Self-pay

## 2016-09-03 ENCOUNTER — Other Ambulatory Visit (RURAL_HEALTH_CENTER): Payer: Self-pay | Admitting: Family

## 2016-09-03 DIAGNOSIS — R053 Chronic cough: Secondary | ICD-10-CM | POA: Insufficient documentation

## 2016-09-03 MED ORDER — BUDESONIDE-FORMOTEROL FUMARATE 80-4.5 MCG/ACT IN AERO
2.0000 | INHALATION_SPRAY | Freq: Two times a day (BID) | RESPIRATORY_TRACT | 0 refills | Status: DC
Start: 2016-09-03 — End: 2016-09-29

## 2016-09-03 NOTE — Telephone Encounter (Signed)
Patient is calling in voicing that the cough she has is currently worse in the morning with a lot of drainage and the cough and drainage gets better as the day goes on. The patient is asking if Dewayne Hatch thinks that this could be allergy related and if so what can she do to help the cough and drainage? [facility] LPN

## 2016-09-03 NOTE — Telephone Encounter (Signed)
Patient is aware and voices that she is taking the Singulair as prescribed. Patient would like to try the Symbicort inhaler and then will contact the office to follow-up. [facility] LPN

## 2016-09-03 NOTE — Telephone Encounter (Signed)
She was given a prescription for Singulair which would address allergies. Has she been taking? We had discussed trying a sample of an inhaler (Symbicort) if the Singulair did not help to control her cough. I think she should try that. If there is no improvement in these measures, we can refer to Dr. Raynald Kemp.

## 2016-09-20 ENCOUNTER — Encounter (RURAL_HEALTH_CENTER): Payer: Self-pay | Admitting: Family

## 2016-09-27 ENCOUNTER — Other Ambulatory Visit (RURAL_HEALTH_CENTER): Payer: Self-pay | Admitting: Family

## 2016-09-27 DIAGNOSIS — Z1211 Encounter for screening for malignant neoplasm of colon: Secondary | ICD-10-CM

## 2016-09-29 ENCOUNTER — Other Ambulatory Visit (RURAL_HEALTH_CENTER): Payer: Self-pay

## 2016-09-29 DIAGNOSIS — R053 Chronic cough: Secondary | ICD-10-CM

## 2016-09-29 DIAGNOSIS — R059 Cough, unspecified: Secondary | ICD-10-CM

## 2016-09-29 MED ORDER — BUDESONIDE-FORMOTEROL FUMARATE 80-4.5 MCG/ACT IN AERO
2.0000 | INHALATION_SPRAY | Freq: Two times a day (BID) | RESPIRATORY_TRACT | 5 refills | Status: DC
Start: 2016-09-29 — End: 2016-11-10

## 2016-09-29 MED ORDER — MONTELUKAST SODIUM 10 MG PO TABS
10.0000 mg | ORAL_TABLET | Freq: Every evening | ORAL | 5 refills | Status: DC
Start: 2016-09-29 — End: 2016-10-25

## 2016-09-29 NOTE — Telephone Encounter (Signed)
Pt ran out of Montelukast and the sample of Symbicort and cough has returned. She is hoping she can get Rx for them again. Please send to [facility] in [place]. //TK/MA

## 2016-09-29 NOTE — Telephone Encounter (Signed)
Pt is aware//TK/MA

## 2016-10-24 ENCOUNTER — Other Ambulatory Visit (RURAL_HEALTH_CENTER): Payer: Self-pay | Admitting: Family

## 2016-10-24 DIAGNOSIS — R059 Cough, unspecified: Secondary | ICD-10-CM

## 2016-10-27 ENCOUNTER — Telehealth (RURAL_HEALTH_CENTER): Payer: Self-pay

## 2016-10-27 NOTE — Telephone Encounter (Signed)
I'm don't recall that she has discussed any ear problems with me. She would need an appointment.

## 2016-10-27 NOTE — Telephone Encounter (Signed)
Patient has been using the swimmers ear drops for several days but it has not cleared up. She asks an Rx be sent to [facility] in [place]. Will patient need an appt? TK MA

## 2016-10-27 NOTE — Telephone Encounter (Signed)
Patient is unable to come in for an office visit, states they have had a death in the family and have to leave for [place] first thing tomorrow morning. She will deal with the ear issue until she can get back home and make an appointment. TK MA

## 2016-11-10 ENCOUNTER — Encounter (RURAL_HEALTH_CENTER): Payer: Self-pay | Admitting: Family

## 2016-11-10 ENCOUNTER — Ambulatory Visit: Payer: Commercial Managed Care - POS | Attending: Family | Admitting: Family

## 2016-11-10 VITALS — BP 141/79 | HR 68 | Temp 98.2°F | Resp 18 | Wt 200.0 lb

## 2016-11-10 DIAGNOSIS — R05 Cough: Secondary | ICD-10-CM

## 2016-11-10 DIAGNOSIS — R053 Chronic cough: Secondary | ICD-10-CM

## 2016-11-10 DIAGNOSIS — H9202 Otalgia, left ear: Secondary | ICD-10-CM

## 2016-11-10 DIAGNOSIS — R059 Cough, unspecified: Secondary | ICD-10-CM

## 2016-11-10 MED ORDER — MONTELUKAST SODIUM 10 MG PO TABS
10.0000 mg | ORAL_TABLET | Freq: Every evening | ORAL | 3 refills | Status: DC
Start: 2016-11-10 — End: 2017-09-04

## 2016-11-10 MED ORDER — BUDESONIDE-FORMOTEROL FUMARATE 80-4.5 MCG/ACT IN AERO
2.0000 | INHALATION_SPRAY | Freq: Two times a day (BID) | RESPIRATORY_TRACT | 3 refills | Status: DC
Start: 2016-11-10 — End: 2020-02-25

## 2016-11-10 MED ORDER — PREDNISONE 20 MG PO TABS
ORAL_TABLET | ORAL | 0 refills | Status: DC
Start: 2016-11-10 — End: 2016-12-31

## 2016-11-10 NOTE — Progress Notes (Signed)
Subjective:    Patient ID: Alicia Barry is a 61 y.o. female.    Alicia Barry is in today with complaints of left ear pain which started on February 19 when she and her husband were driving home from [place]. She states that the ear pain has been external as well as internal. When severe, the pain radiates into her neck and shoulder. She denies any headache congestion or sinus drainage. She has been using a solution of alcohol and vinegar drops in the ear Canal and oral Tylenol, the combination of which helps to improve her symptoms but does not resolve them and the pain escalates. [facility]. She reports that no abnormalities were noted on exam and the practitioner that saw her instructed her to start using Flonase daily. She states that she has been using 2 sprays in each nostril daily since that visit with no significant change in her symptoms.      Review of Systems   Constitutional: Negative.    HENT: Positive for ear pain. Negative for congestion and rhinorrhea.    Respiratory: Negative.    Neurological: Negative.            Objective:    Physical Exam   Constitutional: She is oriented to person, place, and time. She appears well-developed and well-nourished. No distress.   HENT:   Head: Normocephalic and atraumatic.   Right Ear: Hearing, tympanic membrane, external ear and ear canal normal.   Left Ear: Hearing, tympanic membrane, external ear and ear canal normal.   Nose: Nose normal.   Mouth/Throat: Uvula is midline, oropharynx is clear and moist and mucous membranes are normal.   Eyes: EOM are normal. Pupils are equal, round, and reactive to light.   Neck: Normal range of motion. Neck supple. No thyromegaly present.   Cardiovascular: Normal rate, regular rhythm and normal heart sounds.    No murmur heard.  Pulmonary/Chest: Effort normal and breath sounds normal. No respiratory distress. She has no wheezes.   Musculoskeletal: Normal range of motion.   Lymphadenopathy:      She has no cervical adenopathy.   Neurological: She is alert and oriented to person, place, and time.   Skin: Skin is warm and dry.   Psychiatric: She has a normal mood and affect. Her behavior is normal. Judgment and thought content normal.   Vitals reviewed.          Assessment:       1. Left ear pain    2. Cough    3. Chronic cough          Plan:       Rx: Prednisone 20 mg. She is instructed to take 3 tablets daily 2, 2 tablets daily 2 then 1 tablet daily 1. She is scheduled to have a screening colonoscopy on 11/16/16. If symptoms improve but do not completely resolve with the prednisone, she may restart the prednisone after the colonoscopy.    Increase Flonase to twice a day.    If symptoms are not improved or do not resolve, I will refer her to ENT.    Refills of Symbicort and Singulair sent to her mail order pharmacy. I recommend that she stay on these medications for the next 3 months. At that point, she may discontinue and restart as needed if her symptoms of cough returned.      Liam Graham FNP    This note was completed using Dragon Medical speech  recognition software. Grammatical errors, random word insertions, pronoun errors and incomplete sentences are occasional consequences of this technology due to software limitations. If there are questions or concerns about the content of this note or information contained within the body of this dictation they should be addressed with the provider for clarification.

## 2016-11-16 ENCOUNTER — Encounter
Admission: RE | Disposition: A | Discharge status: Home / Self Care | Payer: Self-pay | Source: Home / Self Care | Attending: Family Medicine

## 2016-11-16 ENCOUNTER — Ambulatory Visit: Payer: Commercial Managed Care - POS | Admitting: Certified Registered"

## 2016-11-16 ENCOUNTER — Ambulatory Visit
Admission: RE | Admit: 2016-11-16 | Discharge: 2016-11-16 | Disposition: A | Discharge status: Home / Self Care | Payer: Commercial Managed Care - POS | Attending: Family Medicine | Admitting: Family Medicine

## 2016-11-16 ENCOUNTER — Ambulatory Visit: Payer: Commercial Managed Care - POS | Admitting: Family Medicine

## 2016-11-16 DIAGNOSIS — D125 Benign neoplasm of sigmoid colon: Secondary | ICD-10-CM | POA: Insufficient documentation

## 2016-11-16 DIAGNOSIS — K5730 Diverticulosis of large intestine without perforation or abscess without bleeding: Secondary | ICD-10-CM | POA: Insufficient documentation

## 2016-11-16 DIAGNOSIS — Z1211 Encounter for screening for malignant neoplasm of colon: Secondary | ICD-10-CM | POA: Insufficient documentation

## 2016-11-16 DIAGNOSIS — F329 Major depressive disorder, single episode, unspecified: Secondary | ICD-10-CM | POA: Insufficient documentation

## 2016-11-16 DIAGNOSIS — M1990 Unspecified osteoarthritis, unspecified site: Secondary | ICD-10-CM | POA: Insufficient documentation

## 2016-11-16 DIAGNOSIS — K635 Polyp of colon: Secondary | ICD-10-CM | POA: Insufficient documentation

## 2016-11-16 DIAGNOSIS — F419 Anxiety disorder, unspecified: Secondary | ICD-10-CM | POA: Insufficient documentation

## 2016-11-16 DIAGNOSIS — E785 Hyperlipidemia, unspecified: Secondary | ICD-10-CM | POA: Insufficient documentation

## 2016-11-16 DIAGNOSIS — K219 Gastro-esophageal reflux disease without esophagitis: Secondary | ICD-10-CM | POA: Insufficient documentation

## 2016-11-16 HISTORY — PX: COLONOSCOPY, POLYPECTOMY: SHX3473

## 2016-11-16 SURGERY — COLONOSCOPY, REMOVAL OF TUMOR, POLYP, OR OTHER LESION BY SNARE TECHNIQUE
Anesthesia: Anesthesia MAC / Sedation | Site: Anus | Wound class: Clean Contaminated

## 2016-11-16 MED ORDER — FENTANYL CITRATE (PF) 50 MCG/ML IJ SOLN (WRAP)
INTRAMUSCULAR | Status: AC
Start: 2016-11-16 — End: ?
  Filled 2016-11-16: qty 2

## 2016-11-16 MED ORDER — PROPOFOL 200 MG/20ML IV EMUL
INTRAVENOUS | Status: AC
Start: 2016-11-16 — End: ?
  Filled 2016-11-16: qty 20

## 2016-11-16 MED ORDER — GLYCOPYRROLATE 0.2 MG/ML IJ SOLN
INTRAMUSCULAR | Status: DC | PRN
Start: 2016-11-16 — End: 2016-11-16
  Administered 2016-11-16: 0.2 mg via INTRAVENOUS

## 2016-11-16 MED ORDER — GLYCOPYRROLATE 0.2 MG/ML IJ SOLN
INTRAMUSCULAR | Status: AC
Start: 2016-11-16 — End: ?
  Filled 2016-11-16: qty 1

## 2016-11-16 MED ORDER — FENTANYL CITRATE (PF) 50 MCG/ML IJ SOLN (WRAP)
INTRAMUSCULAR | Status: DC | PRN
Start: 2016-11-16 — End: 2016-11-16
  Administered 2016-11-16: 100 ug via INTRAVENOUS

## 2016-11-16 MED ORDER — PROPOFOL 200 MG/20ML IV EMUL
INTRAVENOUS | Status: DC | PRN
Start: 2016-11-16 — End: 2016-11-16
  Administered 2016-11-16: 100 mg via INTRAVENOUS
  Administered 2016-11-16: 60 mg via INTRAVENOUS
  Administered 2016-11-16: 100 mg via INTRAVENOUS
  Administered 2016-11-16 (×3): 60 mg via INTRAVENOUS

## 2016-11-16 MED ORDER — LACTATED RINGERS IV SOLN
INTRAVENOUS | Status: DC
Start: 2016-11-16 — End: 2016-11-16

## 2016-11-16 SURGICAL SUPPLY — 21 items
BASKET RETRIEVAL ROTH NET STD (Supply) IMPLANT
BRUSH CLEANING 639 (Supply) ×2 IMPLANT
CANNULA ADULT NASAL (Supply) ×1 IMPLANT
FORCEP BIOPSY ALLIGA JAW  2.8 (Supply) IMPLANT
FORCEP BIOPSY DISP. (Supply) IMPLANT
FORCEP BIOPSY HOT ALLIGATOR  D (Supply) IMPLANT
FORCEP RADIAL JAW JUMBO 240CM (Supply) ×1 IMPLANT
FORMALIN 15ML CONTAINER (BY CS (Supply) IMPLANT
KIT ENDO BEDSIDE W BUTTON (Supply) ×2 IMPLANT
MARKER ENDOSCOPIC SPOT (Supply) IMPLANT
MASK OXYGEN W/TUBING ADULT (Supply) IMPLANT
NDL BLUNT FILL 18GA X 1.5 (Supply) IMPLANT
PAD GROUNDING REM  (ELECTRODE) (Supply) IMPLANT
POLYTRAP (Supply) IMPLANT
QUICK CLIP II SGL USE ROTATABL (Supply) IMPLANT
SNARE WIRE DISP 10MM OVAL (Supply) IMPLANT
SNARE WIRE DISP 25MM LOOP (Supply) IMPLANT
SOL SALINE IRRIG 1000ML (Solutions) ×2 IMPLANT
SPONGE ENDOZIME (Supply) ×2 IMPLANT
SYRINGE 50CC LL  NO WIDE FLANG (Supply) IMPLANT
UNDERPAD BLUE 23X36  LF (Supply) ×2 IMPLANT

## 2016-11-16 NOTE — Discharge Instructions (Signed)
Diverticulosis    Diverticulosismeans that small pouches have formed in the wall ofyourlarge intestine (colon). Most often, this problem causes no symptoms and is common as people age. But the pouches in the colon are at risk of becoming infected. When this happens, the condition is called diverticulitis. Although most people with diverticulosis never develop diverticulitis, it is still not uncommon. Rectal bleeding can also occur and in less common situations, a type of colon inflammation called colitis.  While most people do nothave symptoms, some people with diverticulosis mayhave:   Abdominal cramps and pain   Bloating   Constipation   Change in bowel habits  Causes  The exact cause of diverticulosis (and diverticulitis) has not been proved, buta few things are associated with the condition:   Low-fiber diet   Constipation   Lack of exercise  Your healthcare provider will talk with you about how to manage your condition. Diet changes may be all that are needed to help control diverticulosis and prevent progression to diverticulitis. If you develop diverticulitis, you will likely needother treatments.  Home care  You may be told to take fiber supplements daily. Fiber adds bulk to the stool so that it passes through the colon more easily. Stool softeners may be recommended. You may also be given medications for pain relief. Be sure to take all medications as directed.  In the past, people were told to avoid corn, nuts, and seeds. This is no longer necessary.  Follow these guidelines when caring for yourself at home:   Eat unprocessed foods that are high in fiber. Whole grains, fruits, and vegetables are good choices.   Drink 6 to 8 glasses of water every day unless your healthcare provider has you limit how muchfluid you should have.   Watch for changes in your bowel movements. Tell your provider if you notice any changes.   Begin an exercise program. Ask your provider how to get started.  Generally, walking is the best.   Get plenty of rest and sleep.  Follow-up care  Follow up with your healthcare provider, oras advised. Regular visits may be needed to check on your health. Sometimes special procedures such as colonoscopy, are needed after an episode of diverticulitis or blooding. Be sure to keep all your appointments.  If a stool sample was taken, or cultures were done, you should be told if they are positive, or if your treatment needs to be changed. You can call as directed for the results.  If X-rays were done,a radiologist will look at them. You will be told if there is a change in your treatment.  If antibiotics were prescribed, be sure to finish them all.  When to seek medical advice  Call your healthcare provider right awayif any of these occur:   Fever of 100.4F (38C) or higher, or as directed by your healthcare provider   Severe cramps in the lower left side of the abdomen or pain that is gettingworse   Tenderness in the lower left side of the abdomen or worsening pain throughout the abdomen   Diarrhea or constipation that doesn't get better within 24 hours   Nausea and vomiting   Bleeding from the rectum  Call 911  Call emergency services if any of the following occur:   Trouble breathing   Confusion   Very drowsy or trouble awakening   Fainting or loss of consciousness   Rapid heart rate   Chest pain  Date Last Reviewed: 08/28/2014   2000-2016 [employer] [address], [place] [address]. All rights reserved. This information is not intended as a substitute for professional medical care. Always follow your healthcare professional's instructions.

## 2016-11-16 NOTE — Op Note (Signed)
COLONOSCOPY PROCEDURE NOTE    Procedure:  Colonoscopy.  Facility:  [facility].  Date:  11/16/16.  Patient ID:  [name] is a 61 y.o. female, 1956-01-24, [id number].  Comorbidities/ASA Classification:    Past Medical History:   Diagnosis Date   . Anxiety    . Arthritis    . Depression    . Disease of lung    . Gastroesophageal reflux disease    . Hyperlipidemia    , ASA classification II.  Prior Endoscopic History:  Colonoscopy with polyps 5 years ago, 5 year f/u recommended.  Indication/Pre-operative diagnosis:  Colon cancer screening.  Post-operative diagnosis:  Colon cancer screening.  Permit:  The procedure, indications, risks, benefits and alternatives were discussed with the patient and all questions were answered.  Written consent was obtained and was visualized prior to beginning the procedure.  Physicians:  Olena Leatherwood, MD  Assistants: Circulator: Rebbeca Paul, RN  Scrub Person: Genelle Bal  Description:  The patient was placed in the left lateral decubitus position and sedation was administered by anesthesiology with propofol.  Digital rectal exam was performed with lubricating gel and no masses were palpated, sphincter tone was normal, and no external hemorrhoids were visualized.  The Olympus EVIS EXERA II colonoscope, serial number [id number] was then inserted into the rectum and advanced carefully to the cecum.  The crow's foot, appendiceal orifice, and ileocecal valve were visualized.  The colonoscope was then slowly extubated and the entire colon was carefull inspected, and the colonoscope was retroflexed at the rectum.  The bowel prep was moderate, with Boston Bowel Prep score of 2 in the ascending colon, 2 in the transverse colon, and 2 in the descending colon for a total score of 6.  Other pertinent findings included: mild diverticulosis throughout.  The patient tolerated the procedure well.  Total withdrawal time was 17 minutes.  I then accompanied the patient to  the PACU where the nurses then assumed care.  Specimens:  Lesions biopsied were: 5mm sessile polyps (3) at 30cm, 20cm, and 15 cm in the sigmoid colon via cold forceps polypectomy, and all biopsies were sent for pathologic evalation.  Anesthesia:  Propofol by anesthesiology.  Complications:  None.  EBL:  Minimal.  Disposition:  To home.  The patient will be notified of pathology results and appropriate follow-up interval in a separate document.    CC:  Alicia Resides, FNP.

## 2016-11-16 NOTE — Transfer of Care (Signed)
Anesthesia Transfer of Care Note    Patient: Alicia Barry    Last vitals:   Vitals:    11/16/16 0723   BP: 143/82   Resp: 18   Temp: 37 C (98.6 F)   SpO2: 94%       Oxygen: Room Air     Mental Status:awake and alert     Airway: Natural    Cardiovascular Status:  stable

## 2016-11-16 NOTE — H&P (Signed)
COLONOSCOPY PRE-PROCEDURE NOTE:    S)  Alicia Barry is a 61 y.o. female presenting today for her colonoscopy.  she is having a colonoscopy for colon cancer screening purposes.  Colonoscopy with 'polyps' 5 years ago.  No family history of colon cancer.    O)  Patient Vitals for the past 24 hrs:   BP Temp Temp src Resp SpO2 Height Weight   11/16/16 0723 143/82 98.6 F (37 C) Oral 18 94 % 1.651 m (5\' 5" ) 88 kg (194 lb)   , heart is regular rate and rhythm, lungs are clear to auscultation bilaterally, abdomen is non-distended and non-tender.    A)  61 y.o. female, presenting for colonoscopy.    P)  Proceed with procedure with routine precautions.  Pre-procedure H+P and available medical records have been reviewed and no changes were noted.

## 2016-11-16 NOTE — Anesthesia Postprocedure Evaluation (Signed)
Anesthesia Post Evaluation    Patient: Alicia Barry    Procedures performed: Procedure(s):  COLONOSCOPY, POLYPECTOMY    Anesthesia type: MAC    Patient location:Outpatient    Last vitals:   Vitals:    11/16/16 0859   BP: 140/85   Pulse: 67   Resp: 18   Temp:    SpO2: 98%       Post pain: Patient not complaining of pain, continue current therapy      Mental Status:awake and alert     Respiratory Function: tolerating room air    Cardiovascular: stable    Nausea/Vomiting: patient not complaining of nausea or vomiting    Hydration Status: adequate    Post assessment: no apparent anesthetic complications and no reportable events

## 2016-11-16 NOTE — Discharge Instr - AVS First Page (Signed)
[facility]   Colonoscopy Discharge Instructions    1.  Since you have had sedation, for the next 24 hours it is best to:   A.  Have someone stay with you for the next 3-4 hours.   B.  Not drink alcoholic beverages.   C.  Not make important personal or business decisions.   D.  Not drive a vehicle or operate hazardous machinery.    2.  You may experience the following:   A.  Bloating or lack of appetite.   B.  Excessive gas   C.  Some mild rectal bleeding.   D.  Constipation or diarrhea.    3.  You may limit the amount of discomfort you have by:   A.  Limiting your intake to just what you feel like eating, not what you          think you should eat.   B.  Drink at least 3 glasses of water or other liquid (not alcohol) a day         more than you usually drink.   C.  Take 1 heaping teaspoon of Metamucil or other fiber supplement every         day until stools are consistent.    4.  Please call Dr Felisa Bonier at [phone] if you observe any of the following:   A.  Excessive bleeding from the rectum.  More than 1 piece of toilet paper        or in 2 consecutive stools.   B.  Pain in the stomach that is getting worse instead of better over a        2-3 hour period.   C.  Nausea or vomiting.   D.  Increasing tenderness of the abdominal area throughout the day.   E.  Other unusual symptoms.     5.  Results of the colonoscopy:  Polyps were removed, Diverticulosis    6.  You will receive a letter with your results.    IF YOU THINK YOU HAVE AN EMERGENCY, PLEASE GO TO THE NEAREST EMERGENCY DEPARTMENT OR CALL 911.

## 2016-11-16 NOTE — Anesthesia Preprocedure Evaluation (Signed)
Anesthesia Evaluation    AIRWAY    Mallampati: I    TM distance: >3 FB  Neck ROM: full  Mouth Opening:full   CARDIOVASCULAR    cardiovascular exam normal and regular       DENTAL    no notable dental hx     PULMONARY    pulmonary exam normal and clear to auscultation     OTHER FINDINGS              Relevant Problems   No active problems are marked relevant to this note.               Anesthesia Plan    ASA 1     MAC                     Detailed anesthesia plan: MAC            informed consent obtained                   Signed by: Leonarda Salon 11/16/16 7:36 AM

## 2016-11-17 ENCOUNTER — Encounter: Payer: Self-pay | Admitting: Family Medicine

## 2016-11-26 ENCOUNTER — Other Ambulatory Visit (RURAL_HEALTH_CENTER): Payer: Self-pay | Admitting: Family

## 2016-11-26 DIAGNOSIS — E782 Mixed hyperlipidemia: Secondary | ICD-10-CM

## 2016-12-05 ENCOUNTER — Encounter (RURAL_HEALTH_CENTER): Payer: Self-pay | Admitting: Family

## 2016-12-06 ENCOUNTER — Other Ambulatory Visit (RURAL_HEALTH_CENTER): Payer: Self-pay | Admitting: Family

## 2016-12-06 DIAGNOSIS — H9202 Otalgia, left ear: Secondary | ICD-10-CM

## 2016-12-06 DIAGNOSIS — R42 Dizziness and giddiness: Secondary | ICD-10-CM

## 2016-12-08 ENCOUNTER — Other Ambulatory Visit (RURAL_HEALTH_CENTER): Payer: Self-pay | Admitting: Family

## 2016-12-08 DIAGNOSIS — R42 Dizziness and giddiness: Secondary | ICD-10-CM

## 2016-12-24 ENCOUNTER — Ambulatory Visit (INDEPENDENT_AMBULATORY_CARE_PROVIDER_SITE_OTHER): Payer: Commercial Managed Care - POS | Admitting: Otolaryngology

## 2016-12-24 VITALS — Ht 66.0 in | Wt 194.0 lb

## 2016-12-24 DIAGNOSIS — H9202 Otalgia, left ear: Secondary | ICD-10-CM

## 2016-12-24 NOTE — Progress Notes (Signed)
HPI:  Alicia Barry is a 61 y.o. female who's had a few months of off and on left-sided ear pain. It started back in January when they were in [place] tending to a family emergency.  She developed fairly acute left-sided ear pain with some external swelling reminiscent of swimmer's ear that she's had in the past. She treated this with topical vinegar and alcohol and most of her symptoms improved.  She continued to have deep-seated left-sided ear pain without hearing loss. There's been no popping or clicking. When she returned from [place], she did notice more pain when she was [place] the mountains. She has a remote history of eustachian tube dysfunction and multiple ear infections as a child, but none as an adult.  She has noticed a bit of left-sided cheek pain as well, but this doesn't seem to correlate completely with a left-sided ear pain. Some of the discomfort radiates down along her neck. There is no exacerbation with chewing or swallowing. She has no hemoptysis. She does have new dentures She admits to being very stressed and probably dehydrated recently.    PMH:  Past Medical History:   Diagnosis Date   . Anxiety    . Arthritis    . Depression    . Disease of lung    . Gastroesophageal reflux disease    . Hyperlipidemia        PSH:  Past Surgical History:   Procedure Laterality Date   . ANKLE SURGERY Right 02/02/2016   . APPENDECTOMY     . BREAST BIOPSY     . breast biospy Right    . COLONOSCOPY, POLYPECTOMY N/A 11/16/2016    Procedure: COLONOSCOPY, POLYPECTOMY;  Surgeon: Olena Leatherwood, MD;  Location: [facility];  Service: Gastroenterology;  Laterality: N/A;   . FRACTURE SURGERY Left    . REPLACEMENT TOTAL KNEE Left    . SHOULDER ARTHROSCOPY Right    . TONSILLECTOMY Bilateral        Meds:    Current Outpatient Prescriptions:   .  ALPRAZolam (XANAX) 1 MG tablet, Take 1 mg by mouth. Patient takes 1 mg in the morning and at night. Patient takes 0.5mg  in the afternoon. , Disp: , Rfl:   .   Ascorbic Acid (VITAMIN C) 1000 MG tablet, Take 1,000 mg by mouth 2 (two) times daily., Disp: , Rfl:   .  budesonide-formoterol (SYMBICORT) 80-4.5 MCG/ACT inhaler, Inhale 2 puffs into the lungs 2 (two) times daily., Disp: 3 Inhaler, Rfl: 3  .  Cyclobenzaprine HCl (FLEXERIL PO), Take 10 tablets by mouth 3 (three) times daily as needed.Patient takes as needed. Does not recall the dosage.   , Disp: , Rfl:   .  DULoxetine (CYMBALTA) 60 MG capsule, Take 60 mg by mouth daily., Disp: , Rfl:   .  fluticasone (FLONASE) 50 MCG/ACT nasal spray, 2 sprays in each nostril 1-2 times a day, Disp: 16 g, Rfl: 5  .  folic acid (FOLVITE) 1 MG tablet, TAKE 1 TABLET DAILY, Disp: 100 tablet, Rfl: 3  .  montelukast (SINGULAIR) 10 MG tablet, Take 1 tablet (10 mg total) by mouth nightly., Disp: 90 tablet, Rfl: 3  .  Naproxen-Esomeprazole (VIMOVO) 500-20 MG Tablet Delayed Response, Take 1 tablet by mouth daily as needed., Disp: , Rfl:   .  pantoprazole (PROTONIX) 40 MG tablet, Take 1 tablet (40 mg total) by mouth daily., Disp: 90 tablet, Rfl: 3  .  predniSONE (DELTASONE) 20 MG tablet, 3 tabs daily  x 3, 2 tabs daily x 3, 1 tab daily x 3, 1/2 tab daily x 3, Disp: 21 tablet, Rfl: 0  .  simvastatin (ZOCOR) 20 MG tablet, TAKE 1 TABLET NIGHTLY, Disp: 90 tablet, Rfl: 0  .  vitamin B-12 (CYANOCOBALAMIN) 1000 MCG tablet, Take 1,000 mcg by mouth daily., Disp: , Rfl:   .  Vitamin D3 (CHOLECALCIFEROL) 5000 UNITS Cap capsule, Take by mouth daily., Disp: , Rfl:     Allergies:  Allergies   Allergen Reactions   . Ibuprofen Anaphylaxis   . Percocet [Oxycodone-Acetaminophen] Itching       FH:  There's no family history of bleeding disorders or problems with anesthesia    SH:  Social History     Social History   . Marital status: Married     Spouse name: N/A   . Number of children: N/A   . Years of education: N/A     Occupational History   . Not on file.     Social History Main Topics   . Smoking status: Former Smoker     Quit date: 11/10/2001   . Smokeless  tobacco: Never Used   . Alcohol use No   . Drug use: No   . Sexual activity: Yes     Partners: Male     Other Topics Concern   . Not on file     Social History Narrative   . No narrative on file       ROS:  ENT system as noted above.  Neuro, GI, cardiac and respiratory systems reviewed an pertinent positives are noted above    EXAM:    General:  Well developed, well nourished.  Comfortable in no apparent distress.  Verbal communication is normal    Head:  Normocephalic, atraumatic.    Eyes:  Pupils equal, round and reactive to light.  No nystagmus    Ears:  External ears without deformity  External ear canal clear without mass.    TM intact without perforation.  There is sclerosis bilaterally greater on the right than on the left. There is no inflammation along the tympanic membrane  No fluid identified  She has very long. The drapes over her ears  The skin in the bony canal was extremely thin    Nose:  External nose is unremarkable.  Anterior rhinoscopy reveals moist pink mucosa.  Inferior turbinates are pink.  Nasal septum is midline.  No mucupurulence seen.    Oral Cavity/ Oropharynx:  Mucous membranes moist and pink.  Hard and soft palate intact without masses.  Gingiva pink.  Dentition is absent. Parotid ducts are normal. Posterior pharyngeal wall without exudate or discharge.  She has no crepitus or tenderness along the temporal mandibular joint    Neck:  No parotid or submandibular enlargement.  She has some very slight tenderness along the left parotid  Supple without masses, asymmetry or crepitus.  Trachea is midline.  Thyroid not enlarged.  There is no cervical lymphadenopathy    Neuro:  Cranial nerves 2-12 intact, symmetric bilaterally    Respiratory:  Breathing comfortably without stridor or sterter.  No retractions or cyanosis.    ASSESSMENT/PLAN:  Alicia Barry s a very pleasant 61 year old female with intermittent left-sided otalgia over the last couple of months. Her symptoms would suggest a  very mild otitis externa,, but some of her exam suggests the possibility of a very mild parotitis. I recommended treatment for both. She doesn't need antibiotics for this, I did recommend  sialagogues warm compresses and adequate hydration. She should keep her left ear canal dry. She will start plugging the ear while she's showering, and make sure that her wet hair doesn't drape over top of the ear canal. In addition, she may use a topical emollient such as mineral oil to help protect and coat the external canal skin which is particularly dry and atrophic. If the pain persists, we may want to proceed with laryngoscopy to make sure that this isn't referred pain from the pharynx        This note was completed using Dragon Medical speech recognition software. Grammatical errors, random word insertions, pronoun errors and incomplete sentences are occasional consequences of this technology due to software limitations. If there are questions or concerns about the content of this note or information contained within the body of this dictation they should be addressed with the provider for clarification.

## 2016-12-31 ENCOUNTER — Encounter (RURAL_HEALTH_CENTER): Payer: Self-pay | Admitting: Family

## 2016-12-31 ENCOUNTER — Ambulatory Visit: Payer: Commercial Managed Care - POS | Attending: Family | Admitting: Family

## 2016-12-31 VITALS — BP 121/79 | HR 79 | Temp 98.6°F | Resp 17 | Wt 191.6 lb

## 2016-12-31 DIAGNOSIS — J40 Bronchitis, not specified as acute or chronic: Secondary | ICD-10-CM

## 2016-12-31 DIAGNOSIS — J45909 Unspecified asthma, uncomplicated: Secondary | ICD-10-CM | POA: Insufficient documentation

## 2016-12-31 MED ORDER — PREDNISONE 20 MG PO TABS
ORAL_TABLET | ORAL | 0 refills | Status: DC
Start: 2016-12-31 — End: 2017-05-09

## 2016-12-31 MED ORDER — AZITHROMYCIN 250 MG PO TABS
ORAL_TABLET | ORAL | 0 refills | Status: DC
Start: 2016-12-31 — End: 2017-05-09

## 2016-12-31 MED ORDER — GUAIFENESIN-CODEINE 100-10 MG/5ML PO SYRP
10.0000 mL | ORAL_SOLUTION | Freq: Three times a day (TID) | ORAL | 0 refills | Status: DC | PRN
Start: 2016-12-31 — End: 2017-05-09

## 2016-12-31 NOTE — Progress Notes (Signed)
Subjective:    Patient ID: Alicia Barry is a 61 y.o. female.    Alicia Barry is in today, accompanied by her husband, with complaints of a fitful barking cough which started 4 days ago. She has continued to use her Symbicort inhaler, Flonase and that symptoms have progressed to include chest tightness, mild shortness of breath with exertion and posterior lung pain. There is no significant headache congestion or sinus drainage and there is no sputum production.    Alicia Barry states that she did see Dr. Raynald Kemp for her ear pain. He did not provide a definite source or cause for the pain but states that the ear may be staying irritated because there was no wax to coat and protect the canal. Alicia Barry had been using a mixture of Phenergan and hydrogen peroxide to flush the ear.      Review of Systems   Constitutional: Positive for fatigue.   HENT: Positive for sinus pressure. Negative for congestion, postnasal drip and rhinorrhea.    Respiratory: Positive for cough, chest tightness and shortness of breath.    Musculoskeletal: Positive for back pain (Upper back, pulmonic regions not associated with movement or cough.).           Objective:    Physical Exam   Constitutional: She is oriented to person, place, and time. She appears well-developed and well-nourished. No distress.   HENT:   Head: Normocephalic and atraumatic.   Right Ear: Hearing, tympanic membrane, external ear and ear canal normal.   Left Ear: Hearing, tympanic membrane, external ear and ear canal normal.   Nose: Nose normal.   Mouth/Throat: Uvula is midline, oropharynx is clear and moist and mucous membranes are normal.   Eyes: EOM are normal. Pupils are equal, round, and reactive to light.   Neck: Normal range of motion. Neck supple. No thyromegaly present.   Cardiovascular: Normal rate, regular rhythm and normal heart sounds.  Exam reveals no friction rub.    No murmur heard.  Pulmonary/Chest: Effort normal. No respiratory distress.   Inspiratory rubs noted in the  right mid and lower lobe as well as the left lower lobe. A dry frequent cough is noted throughout the exam.   Musculoskeletal: Normal range of motion.   Lymphadenopathy:     She has no cervical adenopathy.   Neurological: She is alert and oriented to person, place, and time.   Skin: Skin is warm and dry.   Psychiatric: She has a normal mood and affect. Her behavior is normal. Judgment and thought content normal.   Vitals reviewed.          Assessment:       1. Bronchitis          Plan:       Rx: Z-Pak.    Rx: Prednisone 20 mg, tapering from 60 mg to 10 mg over 12 days.    Rx: Robitussin with codeine for use as needed for cough in the evening.    Push fluids.    Continue other routine medications.    Follow-up if symptoms do not improve.    Liam Graham FNP    This note was completed using Dragon Medical speech recognition software. Grammatical errors, random word insertions, pronoun errors and incomplete sentences are occasional consequences of this technology due to software limitations. If there are questions or concerns about the content of this note or information contained within the body of this dictation they should be addressed with the provider for clarification.

## 2017-01-24 ENCOUNTER — Other Ambulatory Visit (RURAL_HEALTH_CENTER): Payer: Self-pay | Admitting: Family

## 2017-01-26 NOTE — Telephone Encounter (Signed)
Please clarify if the patient is taking Vimovo which also has an acid reducing medication within a period. If so, does she need the Vimovo as well as the Protonix?

## 2017-01-27 NOTE — Telephone Encounter (Signed)
Left a message for the patient to call the office. HC LPN

## 2017-01-28 NOTE — Telephone Encounter (Signed)
Patient reports that she only takes the Vimovo as needed and the Protonix on a daily basis. [facility] LPN

## 2017-02-27 ENCOUNTER — Other Ambulatory Visit (RURAL_HEALTH_CENTER): Payer: Self-pay | Admitting: Family

## 2017-02-27 DIAGNOSIS — E782 Mixed hyperlipidemia: Secondary | ICD-10-CM

## 2017-05-08 ENCOUNTER — Emergency Department (HOSPITAL_COMMUNITY): Payer: Managed Care, Other (non HMO)

## 2017-05-08 ENCOUNTER — Encounter (HOSPITAL_COMMUNITY): Payer: Self-pay | Admitting: *Deleted

## 2017-05-08 ENCOUNTER — Emergency Department (HOSPITAL_COMMUNITY)
Admission: EM | Admit: 2017-05-08 | Discharge: 2017-05-08 | Disposition: A | Discharge status: Home / Self Care | Payer: Managed Care, Other (non HMO) | Attending: Emergency Medicine | Admitting: Emergency Medicine

## 2017-05-08 DIAGNOSIS — Y999 Unspecified external cause status: Secondary | ICD-10-CM | POA: Diagnosis not present

## 2017-05-08 DIAGNOSIS — S59911A Unspecified injury of right forearm, initial encounter: Secondary | ICD-10-CM | POA: Diagnosis present

## 2017-05-08 DIAGNOSIS — Y929 Unspecified place or not applicable: Secondary | ICD-10-CM | POA: Diagnosis not present

## 2017-05-08 DIAGNOSIS — S52591A Other fractures of lower end of right radius, initial encounter for closed fracture: Secondary | ICD-10-CM

## 2017-05-08 DIAGNOSIS — W19XXXA Unspecified fall, initial encounter: Secondary | ICD-10-CM

## 2017-05-08 DIAGNOSIS — Z79899 Other long term (current) drug therapy: Secondary | ICD-10-CM | POA: Diagnosis not present

## 2017-05-08 DIAGNOSIS — S52501A Unspecified fracture of the lower end of right radius, initial encounter for closed fracture: Secondary | ICD-10-CM | POA: Diagnosis not present

## 2017-05-08 DIAGNOSIS — Y939 Activity, unspecified: Secondary | ICD-10-CM | POA: Diagnosis not present

## 2017-05-08 DIAGNOSIS — Z87891 Personal history of nicotine dependence: Secondary | ICD-10-CM | POA: Insufficient documentation

## 2017-05-08 DIAGNOSIS — W010XXA Fall on same level from slipping, tripping and stumbling without subsequent striking against object, initial encounter: Secondary | ICD-10-CM | POA: Diagnosis not present

## 2017-05-08 HISTORY — DX: Depression, unspecified: F32.A

## 2017-05-08 HISTORY — DX: Major depressive disorder, single episode, unspecified: F32.9

## 2017-05-08 MED ORDER — ACETAMINOPHEN 500 MG PO TABS
1000.0000 mg | ORAL_TABLET | Freq: Once | ORAL | Status: AC
  Administered 2017-05-08: 1000 mg via ORAL
  Filled 2017-05-08: qty 2

## 2017-05-08 MED ORDER — TRAMADOL HCL 50 MG PO TABS: 50.0000 mg | ORAL_TABLET | Freq: Four times a day (QID) | ORAL | 0 refills | Status: AC | PRN

## 2017-05-08 MED ORDER — OXYCODONE HCL 5 MG PO CAPS: 5.0000 mg | ORAL_CAPSULE | Freq: Four times a day (QID) | ORAL | 0 refills | Status: DC | PRN

## 2017-05-08 NOTE — ED Notes (Signed)
Pt's allergies updated, pt states she is unable to take percocet. PA notified and will get new pain RX for d/c.

## 2017-05-08 NOTE — ED Provider Notes (Signed)
[facility]ENC[facility] DEPT Provider Note   CSN: [id number] Arrival date & time: 05/08/17  [id number]     History   Chief Complaint Chief Complaint  Patient presents with  . Arm Injury    HPI Sherri Holloway is a 61 y.o. female presents to ED with right hand and right wrist pain and swelling after mechanical fall. Patient states she tripped and caught herself and landed on her right outstretched hand yesterday. Denies head trauma, LOC, neck pain. Exacerbating factors include right wrist movement and palpation. Has taken anti-inflammatories which have eased off some of the pain. Denies numbness, tingling, or other complaints at this time. No previous history of right upper extremity injury or surgeries. She is retired. She is RHD.  HPI  Past Medical History:  Diagnosis Date  . Depression     There are no active problems to display for this patient.   Past Surgical History:  Procedure Laterality Date  . APPENDECTOMY      OB History    No data available       Home Medications    Prior to Admission medications   Medication Sig Start Date End Date Taking? Authorizing Provider  oxycodone (OXY-IR) 5 MG capsule Take 1 capsule (5 mg total) by mouth every 6 (six) hours as needed. 05/08/17 05/10/17  Liberty HandyGibbons, Darrell Leonhardt J, PA-C    Family History No family history on file.  Social History Social History  Substance Use Topics  . Smoking status: Former Games developermoker  . Smokeless tobacco: Never Used  . Alcohol use Yes     Allergies   Patient has no allergy information on record.   Review of Systems Review of Systems  Musculoskeletal: Positive for arthralgias and joint swelling. Negative for back pain, gait problem and neck pain.  Skin: Positive for color change.  Neurological: Negative for numbness.     Physical Exam Updated Vital Signs BP (!) 144/76 (BP Location: Right Arm)   Pulse 78   Temp 98.2 F (36.8 C)   Resp 16   SpO2 97%   Physical Exam  Constitutional: She is oriented to  person, place, and time. She appears well-developed and well-nourished. No distress.  NAD.  HENT:  Head: Normocephalic and atraumatic.  Right Ear: External ear normal.  Left Ear: External ear normal.  Nose: Nose normal.  Eyes: Conjunctivae and EOM are normal. No scleral icterus.  Neck: Normal range of motion. Neck supple.  Cardiovascular: Normal rate, regular rhythm and normal heart sounds.   No murmur heard. Pulmonary/Chest: Effort normal and breath sounds normal. She has no wheezes.  Musculoskeletal: Normal range of motion. She exhibits edema and tenderness. She exhibits no deformity.  Mild edema and ecchymosis to distal right radius,  scaphoid and thenar prominence Slightly decreased active ROM of the right wrist secondary to pain, pronation and supination cause the most significant pain Able to make a fist without difficulty or pain PROM of right elbow, shoulder and fingers normal without pain No focal tenderness to right elbow  Neurological: She is alert and oriented to person, place, and time.  Skin: Skin is warm and dry. Capillary refill takes less than 2 seconds.  Ecchymosis to right thenar prominence Abrasion to posterior right elbow and right ankle  Psychiatric: She has a normal mood and affect. Her behavior is normal. Judgment and thought content normal.  Nursing note and vitals reviewed.    ED Treatments / Results  Labs (all labs ordered are listed, but only abnormal results are displayed) Labs Reviewed -  No data to display  EKG  EKG Interpretation None       Radiology Dg Forearm Right  Result Date: 05/08/2017 CLINICAL DATA:  Right forearm after fall yesterday. EXAM: RIGHT FOREARM - 2 VIEW COMPARISON:  None. FINDINGS: Nondisplaced fracture is seen involving the distal right radius with intra-articular extension. No soft tissue abnormality is noted. Joint spaces are intact. IMPRESSION: Nondisplaced distal right radial fracture with intra-articular extension.  Electronically Signed   By: Lupita Raider, M.D.   On: 05/08/2017 10:51   Dg Wrist Complete Right  Result Date: 05/08/2017 CLINICAL DATA:  Right wrist pain after fall yesterday. EXAM: RIGHT WRIST - COMPLETE 3+ VIEW COMPARISON:  None. FINDINGS: Nondisplaced fracture is seen involving the distal right radius. Joint spaces are intact. No other fracture or bony abnormality is noted. IMPRESSION: Nondisplaced distal right radial fracture with intra-articular extension. Electronically Signed   By: Lupita Raider, M.D.   On: 05/08/2017 10:46   Dg Hand Complete Right  Result Date: 05/08/2017 CLINICAL DATA:  Right hand pain after fall yesterday. EXAM: RIGHT HAND - COMPLETE 3+ VIEW COMPARISON:  None. FINDINGS: There is no evidence of fracture or dislocation. There is no evidence of arthropathy or other focal bone abnormality. Soft tissues are unremarkable. IMPRESSION: Normal right hand. Electronically Signed   By: Lupita Raider, M.D.   On: 05/08/2017 10:44    Procedures Procedures (including critical care time)  Medications Ordered in ED Medications  acetaminophen (TYLENOL) tablet 1,000 mg (1,000 mg Oral Given 05/08/17 1227)     Initial Impression / Assessment and Plan / ED Course  I have reviewed the triage vital signs and the nursing notes.  Pertinent labs & imaging results that were available during my care of the patient were reviewed by me and considered in my medical decision making (see chart for details).  Clinical Course as of May 08 1232  Wynelle Link May 08, 2017  1135 IMPRESSION: Nondisplaced distal right radial fracture with intra-articular extension. DG Wrist Complete Right [CG]  1208 Spoke to Dr Eulah Pont from hand surgery; recommends sugar tong splint and f/u in clinic in 1-2 days.  [CG]    Clinical Course User Index [CG] Liberty Handy, PA-C    61 year old female with a right nondisplaced distal radial fracture with intra-articular extension after mechanical fall. Dr. Eulah Pont from  hand surgery recommending his sugar tong splint and follow-up in clinic. Extremity is neurovascularly intact. She has focal tenderness and ecchymosis over scaphoid, no fx seen in x-ray.  Dr Eulah Pont suspects this is from known distal radial fx, did not think CT scan wrist indicated at this time.. [place], will provide Dr. Eulah Pont contact information or she may opt to contact hand surgery near her home as soon as possible. Discussed return precautions. Patient verbalized understanding and is agreeable with plan.  Final Clinical Impressions(s) / ED Diagnoses   Final diagnoses:  Fall, initial encounter  Other closed fracture of distal end of right radius, initial encounter    New Prescriptions New Prescriptions   OXYCODONE (OXY-IR) 5 MG CAPSULE    Take 1 capsule (5 mg total) by mouth every 6 (six) hours as needed.     Liberty Handy, PA-C 05/08/17 1233    Tilden Fossa, MD 05/10/17 [id number]

## 2017-05-08 NOTE — Discharge Instructions (Addendum)
You have a non displaced distal radius fracture that extends into the wrist joints.   Dr. Renaye Rakersim Murphy (hand surgery) recommended splint and follow up in his office in 2-3 days.  Please call him to make an appointment for re-evaluation. Alternatively you can call an orthopedist near your home.  Dr Eulah PontMurphy did not think further imaging was needed today as the splint will immobilize your entire hand/wrist.  For mild pain you can take your home anti-inflammatory.  For moderate pain you can take your home anti-inflammatory PLUS 1000 mg tylenol. For severe pain you can take your home anti-inflammatory PLUS tylenol 1000 mg PLUS tramadol 50 mg.   Ice and elevate.   Tramadol is a narcotic pain medication that has risk of overdose, death, dependence and abuse. Do not consume alcohol, drive or use heavy machinery while taking this medication. Do not leave unattended around children. The emergency department has a strict policy regarding prescription of narcotic medications. We are unable to refill this medication in the emergency department for chronic pain. Contact your primary care provider or specialist for chronic pain management and refill on narcotic medications.

## 2017-05-08 NOTE — ED Triage Notes (Signed)
Pt complains of right hand, wrist, and forearm pain since falling down on the sidewalk and catching herself with her right hand. Injury happened yesterday.

## 2017-05-09 ENCOUNTER — Other Ambulatory Visit (RURAL_HEALTH_CENTER): Payer: Self-pay | Admitting: Family

## 2017-05-09 ENCOUNTER — Ambulatory Visit: Payer: Commercial Managed Care - POS | Attending: Family | Admitting: Family

## 2017-05-09 ENCOUNTER — Encounter (RURAL_HEALTH_CENTER): Payer: Self-pay | Admitting: Family

## 2017-05-09 DIAGNOSIS — W010XXA Fall on same level from slipping, tripping and stumbling without subsequent striking against object, initial encounter: Secondary | ICD-10-CM

## 2017-05-09 DIAGNOSIS — S52571D Other intraarticular fracture of lower end of right radius, subsequent encounter for closed fracture with routine healing: Secondary | ICD-10-CM

## 2017-05-09 DIAGNOSIS — S52501D Unspecified fracture of the lower end of right radius, subsequent encounter for closed fracture with routine healing: Secondary | ICD-10-CM | POA: Insufficient documentation

## 2017-05-09 DIAGNOSIS — S52571A Other intraarticular fracture of lower end of right radius, initial encounter for closed fracture: Secondary | ICD-10-CM

## 2017-05-09 MED ORDER — HYDROMORPHONE HCL 4 MG PO TABS
4.0000 mg | ORAL_TABLET | Freq: Three times a day (TID) | ORAL | 0 refills | Status: DC | PRN
Start: 2017-05-09 — End: 2017-07-14

## 2017-05-09 MED ORDER — HYDROMORPHONE HCL 4 MG PO TABS
4.0000 mg | ORAL_TABLET | ORAL | 0 refills | Status: DC | PRN
Start: 2017-05-09 — End: 2017-05-09

## 2017-05-09 MED ORDER — HYDROMORPHONE HCL 4 MG PO TABS
4.0000 mg | ORAL_TABLET | Freq: Four times a day (QID) | ORAL | 0 refills | Status: DC | PRN
Start: 2017-05-09 — End: 2017-05-09

## 2017-05-09 NOTE — Progress Notes (Signed)
Subjective:    Patient ID: Alicia Barry is a 61 y.o. female.    Alicia Barry is in today, accompanied by her husband, requesting pain medication to address a bright wrist fracture which occurred Saturday evening, 05/07/17. She states that she and her husband were at a music festival in [place], [place]. She tripped while walking down a sidewalk and landed palm down on the right hand. Alcohol was not involved with the injury. She was seen in the emergency room of [facility] where an x-ray was done revealing a closed nondisplaced fracture of the distal radius. A bilateral splint was applied, extending from the proximal aspect of the elbow to the palm. She has had no swelling or numbness in her fingers. She was given tramadol for pain but this has not been adequate. She has not been able to sleep since the injury because of the pain. She has been applying ice and keeping the arm elevated. Alicia Barry has an appointment with her orthopedist, Dr. Manson Passey at [facility], tomorrow.      Review of Systems   Musculoskeletal: Positive for arthralgias (Right wrist/arm.).   Neurological: Negative.    Psychiatric/Behavioral: Positive for sleep disturbance (Due to pain).           Objective:    Physical Exam   Constitutional: She is oriented to person, place, and time. She appears well-developed and well-nourished. No distress.   HENT:   Head: Normocephalic and atraumatic.   Eyes: Pupils are equal, round, and reactive to light. EOM are normal.   Cardiovascular: Normal rate and regular rhythm.    Pulmonary/Chest: Effort normal and breath sounds normal. No respiratory distress.   Musculoskeletal:   The right forearm is splinted and wrapped with an Ace wrap. The arm is in a sling. Fingers of the right hand have good mobility and no swelling is noted.   Neurological: She is alert and oriented to person, place, and time.   Psychiatric: She has a normal mood and affect. Her behavior is normal. Judgment and thought  content normal.   Vitals reviewed.          Assessment:       1. Other closed intra-articular fracture of distal end of right radius with routine healing, subsequent encounter          Plan:       Alicia Barry has severe itching when taking Percocet. She has taken Dilaudid in the past with no adverse effects. Rx: Dilaudid 4 mg for use as needed for severe pain.    Continue to apply ice to the wrist.    Follow-up as needed.      Liam Graham FNP    This note was completed using Dragon Medical speech recognition software. Grammatical errors, random word insertions, pronoun errors and incomplete sentences are occasional consequences of this technology due to software limitations. If there are questions or concerns about the content of this note or information contained within the body of this dictation they should be addressed with the provider for clarification.

## 2017-05-29 ENCOUNTER — Other Ambulatory Visit (RURAL_HEALTH_CENTER): Payer: Self-pay | Admitting: Family

## 2017-05-29 DIAGNOSIS — E782 Mixed hyperlipidemia: Secondary | ICD-10-CM

## 2017-05-29 DIAGNOSIS — E538 Deficiency of other specified B group vitamins: Secondary | ICD-10-CM

## 2017-05-29 DIAGNOSIS — E559 Vitamin D deficiency, unspecified: Secondary | ICD-10-CM

## 2017-05-30 NOTE — Telephone Encounter (Signed)
Patient is aware and voices understanding. HC LPN

## 2017-05-30 NOTE — Telephone Encounter (Signed)
Please contact patient as she is past due for fasting blood work. Orders are in the system so she can come in for just the blood work and then we will follow-up with an appointment afterwards.

## 2017-06-15 ENCOUNTER — Other Ambulatory Visit
Admission: RE | Admit: 2017-06-15 | Discharge: 2017-06-15 | Disposition: A | Discharge status: Home / Self Care | Payer: Commercial Managed Care - POS | Source: Ambulatory Visit | Attending: Family | Admitting: Family

## 2017-06-15 ENCOUNTER — Ambulatory Visit: Payer: Commercial Managed Care - POS | Attending: Family Medicine

## 2017-06-15 DIAGNOSIS — E538 Deficiency of other specified B group vitamins: Secondary | ICD-10-CM

## 2017-06-15 DIAGNOSIS — E782 Mixed hyperlipidemia: Secondary | ICD-10-CM

## 2017-06-15 DIAGNOSIS — Z23 Encounter for immunization: Secondary | ICD-10-CM

## 2017-06-15 DIAGNOSIS — E559 Vitamin D deficiency, unspecified: Secondary | ICD-10-CM | POA: Insufficient documentation

## 2017-06-15 LAB — COMPREHENSIVE METABOLIC PANEL
ALT: 11 U/L (ref 0–55)
AST (SGOT): 16 U/L (ref 10–42)
Albumin/Globulin Ratio: 1.13 Ratio (ref 0.70–1.50)
Albumin: 3.6 gm/dL (ref 3.5–5.0)
Alkaline Phosphatase: 81 U/L (ref 40–145)
Anion Gap: 12.8 mMol/L (ref 7.0–18.0)
BUN / Creatinine Ratio: 9.8 Ratio — ABNORMAL LOW (ref 10.0–30.0)
BUN: 9 mg/dL (ref 7–22)
Bilirubin, Total: 0.5 mg/dL (ref 0.1–1.2)
CO2: 25.1 mMol/L (ref 20.0–30.0)
Calcium: 9.2 mg/dL (ref 8.5–10.5)
Chloride: 106 mMol/L (ref 98–110)
Creatinine: 0.92 mg/dL (ref 0.60–1.20)
EGFR: 67 mL/min/{1.73_m2} (ref 60–150)
Globulin: 3.2 gm/dL (ref 2.0–4.0)
Glucose: 106 mg/dL — ABNORMAL HIGH (ref 71–99)
Osmolality Calc: 277 mOsm/kg (ref 275–300)
Potassium: 4.9 mMol/L (ref 3.5–5.3)
Protein, Total: 6.8 gm/dL (ref 6.0–8.3)
Sodium: 139 mMol/L (ref 136–147)

## 2017-06-15 LAB — LIPID PANEL
Cholesterol: 197 mg/dL (ref 75–199)
Coronary Heart Disease Risk: 4.93
HDL: 40 mg/dL — ABNORMAL LOW (ref 45–65)
LDL Calculated: 137 mg/dL
Triglycerides: 99 mg/dL (ref 10–150)
VLDL: 20 (ref 0–40)

## 2017-06-15 LAB — CBC AND DIFFERENTIAL
Basophils %: 0.8 % (ref 0.0–3.0)
Basophils Absolute: 0.1 10*3/uL (ref 0.0–0.3)
Eosinophils %: 2.7 % (ref 0.0–7.0)
Eosinophils Absolute: 0.2 10*3/uL (ref 0.0–0.8)
Hematocrit: 47 % (ref 36.0–48.0)
Hemoglobin: 15.9 gm/dL (ref 12.0–16.0)
Lymphocytes Absolute: 2.3 10*3/uL (ref 0.6–5.1)
Lymphocytes: 34.3 % (ref 15.0–46.0)
MCH: 33 pg (ref 28–35)
MCHC: 34 gm/dL (ref 32–36)
MCV: 99 fL (ref 80–100)
MPV: 10.3 fL — ABNORMAL HIGH (ref 6.0–10.0)
Monocytes Absolute: 0.4 10*3/uL (ref 0.1–1.7)
Monocytes: 5.8 % (ref 3.0–15.0)
Neutrophils %: 56.5 % (ref 42.0–78.0)
Neutrophils Absolute: 3.8 10*3/uL (ref 1.7–8.6)
PLT CT: 153 10*3/uL (ref 130–440)
RBC: 4.76 10*6/uL (ref 3.80–5.00)
RDW: 12.1 % (ref 11.0–14.0)
WBC: 6.7 10*3/uL (ref 4.0–11.0)

## 2017-06-15 LAB — VITAMIN B12 AND FOLATE
Folate: 19 ng/mL (ref 7.0–19.9)
Vitamin B-12: 244 pg/mL (ref 213–816)

## 2017-06-15 NOTE — Progress Notes (Signed)
Date Specimen Drawn:  06/15/2017   Time Specimen Drawn:  10:10AM   Test(s) Ordered:  Vitamin B12 and Folate, Vitamin D, Lipid Panel, CMP, CBC   Disposition:  n/a   Patient's Tolerance:  Good   Location Specimen Drawn:  Right antecubital

## 2017-06-16 LAB — VITAMIN D,25 OH,TOTAL: Vitamin D 25-Hydroxy: 16 ng/mL — ABNORMAL LOW (ref 30–80)

## 2017-06-17 ENCOUNTER — Other Ambulatory Visit
Admission: RE | Admit: 2017-06-17 | Discharge: 2017-06-17 | Disposition: A | Discharge status: Home / Self Care | Payer: Commercial Managed Care - POS | Source: Ambulatory Visit | Attending: Family | Admitting: Family

## 2017-06-17 ENCOUNTER — Other Ambulatory Visit (RURAL_HEALTH_CENTER): Payer: Self-pay | Admitting: Family

## 2017-06-17 DIAGNOSIS — R7309 Other abnormal glucose: Secondary | ICD-10-CM

## 2017-06-17 LAB — HEMOGLOBIN A1C: Hgb A1C, %: 5.3 %

## 2017-06-22 ENCOUNTER — Telehealth (RURAL_HEALTH_CENTER): Payer: Self-pay

## 2017-06-22 NOTE — Telephone Encounter (Signed)
FYI - Patient states she knows her vitamin D is low and is going to try the OTC vitamin D spray before she starts taking an oral supplement, since it gives her indigestion. TK MA

## 2017-07-14 ENCOUNTER — Encounter (RURAL_HEALTH_CENTER): Payer: Self-pay | Admitting: Family

## 2017-07-14 ENCOUNTER — Ambulatory Visit: Payer: Commercial Managed Care - POS | Attending: Family | Admitting: Family

## 2017-07-14 VITALS — BP 137/88 | HR 77 | Temp 98.6°F | Resp 18

## 2017-07-14 DIAGNOSIS — J40 Bronchitis, not specified as acute or chronic: Secondary | ICD-10-CM

## 2017-07-14 MED ORDER — AZITHROMYCIN 250 MG PO TABS
ORAL_TABLET | ORAL | 0 refills | Status: DC
Start: 2017-07-14 — End: 2017-07-28

## 2017-07-14 NOTE — Progress Notes (Signed)
Subjective:    Patient ID: Alicia Barry is a 61 y.o. female.    The patient presents for pleuritic pain for the last 3 days occurring with deep inspiration and cough. Denies any fever or chills. Reports cough and congestion. The patient reports that she recently had stopped Symbicort and recently restarted this medication given her worsening cough. She denies any chest pain or shortness of breath today.         The following portions of the patient's history were reviewed and updated as appropriate: allergies, current medications, past family history, past medical history, past social history, past surgical history and problem list.    Review of Systems   Constitutional: Positive for activity change.   HENT: Positive for congestion.    Eyes: Negative.    Respiratory: Positive for cough.    Cardiovascular: Negative.    Gastrointestinal: Negative.    Endocrine: Negative.    Genitourinary: Negative.    Musculoskeletal: Positive for back pain.   Skin: Negative.    Allergic/Immunologic: Negative.    Neurological: Negative.    Hematological: Negative.    Psychiatric/Behavioral: Negative.            Objective:    Physical Exam   Constitutional: She is oriented to person, place, and time. She appears well-developed and well-nourished.   HENT:   Head: Normocephalic and atraumatic.   Eyes: Pupils are equal, round, and reactive to light. EOM are normal.   Neck: Normal range of motion. Neck supple.   Cardiovascular: Normal rate, regular rhythm and normal heart sounds.    Pulmonary/Chest: Effort normal and breath sounds normal.   Musculoskeletal: Normal range of motion.   Neurological: She is alert and oriented to person, place, and time.   Skin: Skin is warm and dry.           Assessment:       1. Bronchitis          Plan:   Treat with full course of azithromycin. Advised to continue her Symbicort. Xray offered however patient  declined at this time, she will notify the office if she is not improving or if her symptoms  are worsening.

## 2017-07-28 ENCOUNTER — Ambulatory Visit: Payer: Commercial Managed Care - POS | Attending: Family | Admitting: Family

## 2017-07-28 ENCOUNTER — Other Ambulatory Visit
Admission: RE | Admit: 2017-07-28 | Discharge: 2017-07-28 | Disposition: A | Discharge status: Home / Self Care | Payer: Commercial Managed Care - POS | Source: Ambulatory Visit | Attending: Family | Admitting: Family

## 2017-07-28 ENCOUNTER — Encounter (RURAL_HEALTH_CENTER): Payer: Self-pay | Admitting: Family

## 2017-07-28 VITALS — BP 139/71 | HR 71 | Temp 97.6°F | Resp 12 | Ht 66.0 in | Wt 189.8 lb

## 2017-07-28 DIAGNOSIS — E559 Vitamin D deficiency, unspecified: Secondary | ICD-10-CM

## 2017-07-28 DIAGNOSIS — Z1239 Encounter for other screening for malignant neoplasm of breast: Secondary | ICD-10-CM

## 2017-07-28 DIAGNOSIS — Z01419 Encounter for gynecological examination (general) (routine) without abnormal findings: Secondary | ICD-10-CM

## 2017-07-28 DIAGNOSIS — M549 Dorsalgia, unspecified: Secondary | ICD-10-CM

## 2017-07-28 NOTE — Progress Notes (Signed)
Subjective:    Patient ID: Alicia Barry is a 61 y.o. female.    Alicia Barry is in today for a gynecological exam. Her last Pap smear was approximately 2 years ago. Her last mammogram was in December 2016. She had a screening colonoscopy in March of this year which was significant for 2 benign polyps.    She denies any urinary or bowel issues. She denies vaginal discharge or dyspareunia.    Alicia Barry was treated for bronchitis 2 weeks ago and states that those symptoms have completely resolved.    She had fasting blood work done in October which was significant for vitamin D deficiency, slight elevation in cholesterol, slight elevation in glucose with a normal hemoglobin A1c. She was unable to tolerate capsules of vitamin D as it caused symptoms of gastric reflux but she has been using a sublingual spray without adverse effect. She is also been increasing physical activity and walking for exercise.      Review of Systems   Constitutional: Negative.    HENT: Negative.    Respiratory: Negative.    Cardiovascular: Negative.    Gastrointestinal: Negative.    Genitourinary: Negative.    Musculoskeletal: Positive for back pain (Right-sided upper back and shoulder pain associated with an unusually large right breast.).   Psychiatric/Behavioral: Negative.            Objective:    Physical Exam   Constitutional: She is oriented to person, place, and time. She appears well-developed and well-nourished. No distress.   Weight is down 4 pounds. Body mass index is 30.63 kg/m.    HENT:   Head: Normocephalic and atraumatic.   Eyes: Pupils are equal, round, and reactive to light. EOM are normal.   Neck: Normal range of motion. Neck supple. No thyromegaly present.   Cardiovascular: Normal rate, regular rhythm and normal heart sounds.    No murmur heard.  Pulmonary/Chest: Effort normal and breath sounds normal. No respiratory distress. She has no wheezes. Right breast exhibits no mass and no tenderness. Left breast exhibits no mass and  no tenderness. Breasts are asymmetrical (Right breast is significantly larger than the left and I estimate that it weighs 10 pounds or more.).   Abdominal: Soft. Bowel sounds are normal. She exhibits no distension and no mass. There is no tenderness.   Genitourinary: Rectum normal, vagina normal and uterus normal. Pelvic exam was performed with patient supine. There is no rash, tenderness or lesion on the right labia. There is no rash, tenderness or lesion on the left labia. Cervix exhibits no motion tenderness, no discharge and no friability. Right adnexum displays no mass, no tenderness and no fullness. Left adnexum displays no mass, no tenderness and no fullness. No vaginal discharge found.   Musculoskeletal: Normal range of motion.   An intervention/deformity is noted in the superior aspect of the right shoulder: an indention in the musculature corresponding with the location of a bra strap.   Lymphadenopathy:     She has no cervical adenopathy.   Neurological: She is alert and oriented to person, place, and time.   Skin: Skin is warm and dry.   Psychiatric: She has a normal mood and affect. Her behavior is normal. Judgment and thought content normal.   Vitals reviewed.          Assessment:       1. Gynecologic exam normal    2. Vitamin D deficiency    3. Upper back pain on right side  Plan:       Liquid Pap obtained and sent.    Screening mammogram ordered. Bilateral breast ultrasound also ordered for use if needed based on abnormal mammogram findings.    [name] will return in January to repeat fasting blood work.    No prescription refills are needed at this time.    I strongly recommended in consultation regarding right breast reduction in order to improve back and shoulder pain. If this is not addressed now I believe she will have more significant back problems down the road. We will discuss with our referral department with regard to the best way to proceed with this.    Liam Graham FNP    This  note was completed using Dragon Medical speech recognition software. Grammatical errors, random word insertions, pronoun errors and incomplete sentences are occasional consequences of this technology due to software limitations. If there are questions or concerns about the content of this note or information contained within the body of this dictation they should be addressed with the provider for clarification.

## 2017-07-29 ENCOUNTER — Other Ambulatory Visit
Admission: RE | Admit: 2017-07-29 | Discharge: 2017-07-29 | Disposition: A | Discharge status: Home / Self Care | Payer: Commercial Managed Care - POS | Source: Ambulatory Visit | Attending: Family | Admitting: Family

## 2017-07-29 ENCOUNTER — Other Ambulatory Visit: Payer: Self-pay | Admitting: Family

## 2017-07-29 DIAGNOSIS — Z01419 Encounter for gynecological examination (general) (routine) without abnormal findings: Secondary | ICD-10-CM

## 2017-08-04 ENCOUNTER — Encounter (RURAL_HEALTH_CENTER): Payer: Self-pay | Admitting: Family

## 2017-08-04 LAB — VH HPV DNA, HIGH RISK: HPV DNA, high risk: NEGATIVE

## 2017-08-09 ENCOUNTER — Ambulatory Visit
Admission: RE | Admit: 2017-08-09 | Discharge: 2017-08-09 | Disposition: A | Discharge status: Home / Self Care | Payer: Commercial Managed Care - POS | Source: Ambulatory Visit | Attending: Family | Admitting: Family

## 2017-08-09 DIAGNOSIS — Z1239 Encounter for other screening for malignant neoplasm of breast: Secondary | ICD-10-CM

## 2017-08-09 DIAGNOSIS — Z1231 Encounter for screening mammogram for malignant neoplasm of breast: Secondary | ICD-10-CM | POA: Insufficient documentation

## 2017-09-04 ENCOUNTER — Other Ambulatory Visit (RURAL_HEALTH_CENTER): Payer: Self-pay | Admitting: Family

## 2017-09-04 DIAGNOSIS — R053 Chronic cough: Secondary | ICD-10-CM

## 2017-09-06 ENCOUNTER — Other Ambulatory Visit (RURAL_HEALTH_CENTER): Payer: Self-pay

## 2017-09-06 NOTE — Telephone Encounter (Signed)
Patient's last office visit was 07/28/2017. [facility] LPN

## 2017-09-07 MED ORDER — PANTOPRAZOLE SODIUM 40 MG PO TBEC
40.0000 mg | DELAYED_RELEASE_TABLET | Freq: Every day | ORAL | 3 refills | Status: DC
Start: 2017-09-07 — End: 2018-08-17

## 2017-09-09 ENCOUNTER — Other Ambulatory Visit (RURAL_HEALTH_CENTER): Payer: Self-pay | Admitting: Family

## 2017-10-11 ENCOUNTER — Other Ambulatory Visit (RURAL_HEALTH_CENTER): Payer: Self-pay | Admitting: Family

## 2017-10-11 DIAGNOSIS — E782 Mixed hyperlipidemia: Secondary | ICD-10-CM

## 2018-01-02 ENCOUNTER — Other Ambulatory Visit (RURAL_HEALTH_CENTER): Payer: Self-pay | Admitting: Family Medicine

## 2018-01-02 DIAGNOSIS — E782 Mixed hyperlipidemia: Secondary | ICD-10-CM

## 2018-03-31 ENCOUNTER — Other Ambulatory Visit (RURAL_HEALTH_CENTER): Payer: Self-pay | Admitting: Family Medicine

## 2018-03-31 DIAGNOSIS — E782 Mixed hyperlipidemia: Secondary | ICD-10-CM

## 2018-03-31 NOTE — Telephone Encounter (Signed)
For your review, has not been seen since 06/2017.

## 2018-07-05 ENCOUNTER — Other Ambulatory Visit (RURAL_HEALTH_CENTER): Payer: Self-pay | Admitting: Family

## 2018-07-05 DIAGNOSIS — E782 Mixed hyperlipidemia: Secondary | ICD-10-CM

## 2018-07-07 ENCOUNTER — Encounter (RURAL_HEALTH_CENTER): Payer: Self-pay | Admitting: Family

## 2018-07-07 ENCOUNTER — Other Ambulatory Visit
Admission: RE | Admit: 2018-07-07 | Discharge: 2018-07-07 | Disposition: A | Discharge status: Home / Self Care | Payer: Commercial Managed Care - POS | Source: Ambulatory Visit | Attending: Family | Admitting: Family

## 2018-07-07 ENCOUNTER — Ambulatory Visit: Payer: Commercial Managed Care - POS | Attending: Family | Admitting: Family

## 2018-07-07 VITALS — BP 138/74 | HR 73 | Temp 98.3°F | Resp 16 | Ht 65.98 in | Wt 194.7 lb

## 2018-07-07 DIAGNOSIS — J4520 Mild intermittent asthma, uncomplicated: Secondary | ICD-10-CM

## 2018-07-07 DIAGNOSIS — F339 Major depressive disorder, recurrent, unspecified: Secondary | ICD-10-CM

## 2018-07-07 DIAGNOSIS — E538 Deficiency of other specified B group vitamins: Secondary | ICD-10-CM

## 2018-07-07 DIAGNOSIS — Z01419 Encounter for gynecological examination (general) (routine) without abnormal findings: Secondary | ICD-10-CM

## 2018-07-07 DIAGNOSIS — Z23 Encounter for immunization: Secondary | ICD-10-CM

## 2018-07-07 DIAGNOSIS — R7309 Other abnormal glucose: Secondary | ICD-10-CM

## 2018-07-07 DIAGNOSIS — R42 Dizziness and giddiness: Secondary | ICD-10-CM

## 2018-07-07 DIAGNOSIS — E782 Mixed hyperlipidemia: Secondary | ICD-10-CM

## 2018-07-07 DIAGNOSIS — E559 Vitamin D deficiency, unspecified: Secondary | ICD-10-CM

## 2018-07-07 LAB — CBC AND DIFFERENTIAL
Basophils %: 0.6 % (ref 0.0–3.0)
Basophils Absolute: 0.1 10*3/uL (ref 0.0–0.3)
Eosinophils %: 0.9 % (ref 0.0–7.0)
Eosinophils Absolute: 0.1 10*3/uL (ref 0.0–0.8)
Hematocrit: 46.6 % (ref 36.0–48.0)
Hemoglobin: 15.5 gm/dL (ref 12.0–16.0)
Lymphocytes Absolute: 2.5 10*3/uL (ref 0.6–5.1)
Lymphocytes: 21.1 % (ref 15.0–46.0)
MCH: 33 pg (ref 28–35)
MCHC: 33 gm/dL (ref 32–36)
MCV: 99 fL (ref 80–100)
MPV: 8.7 fL (ref 6.0–10.0)
Monocytes Absolute: 0.7 10*3/uL (ref 0.1–1.7)
Monocytes: 5.6 % (ref 3.0–15.0)
Neutrophils %: 71.7 % (ref 42.0–78.0)
Neutrophils Absolute: 8.6 10*3/uL (ref 1.7–8.6)
PLT CT: 259 10*3/uL (ref 130–440)
RBC: 4.72 10*6/uL (ref 3.80–5.00)
RDW: 11.9 % (ref 11.0–14.0)
WBC: 12 10*3/uL — ABNORMAL HIGH (ref 4.0–11.0)

## 2018-07-07 LAB — LIPID PANEL
Cholesterol: 190 mg/dL (ref 75–199)
Coronary Heart Disease Risk: 4.42
HDL: 43 mg/dL — ABNORMAL LOW (ref 45–65)
LDL Calculated: 124 mg/dL
Triglycerides: 113 mg/dL (ref 10–150)
VLDL: 23 (ref 0–40)

## 2018-07-07 LAB — COMPREHENSIVE METABOLIC PANEL
ALT: 11 U/L (ref 0–55)
AST (SGOT): 15 U/L (ref 10–42)
Albumin/Globulin Ratio: 1.31 Ratio (ref 0.80–2.00)
Albumin: 3.8 gm/dL (ref 3.5–5.0)
Alkaline Phosphatase: 82 U/L (ref 40–145)
Anion Gap: 13 mMol/L (ref 7.0–18.0)
BUN / Creatinine Ratio: 12.3 Ratio (ref 10.0–30.0)
BUN: 10 mg/dL (ref 7–22)
Bilirubin, Total: 1 mg/dL (ref 0.1–1.2)
CO2: 24 mMol/L (ref 20–30)
Calcium: 9.2 mg/dL (ref 8.5–10.5)
Chloride: 103 mMol/L (ref 98–110)
Creatinine: 0.81 mg/dL (ref 0.60–1.20)
EGFR: 78 mL/min/{1.73_m2} (ref 60–150)
Globulin: 2.9 gm/dL (ref 2.0–4.0)
Glucose: 101 mg/dL — ABNORMAL HIGH (ref 71–99)
Osmolality Calculated: 271 mOsm/kg — ABNORMAL LOW (ref 275–300)
Potassium: 4 mMol/L (ref 3.5–5.3)
Protein, Total: 6.7 gm/dL (ref 6.0–8.3)
Sodium: 136 mMol/L (ref 136–147)

## 2018-07-07 LAB — T4, FREE: T4 Free: 0.88 ng/dL (ref 0.70–1.48)

## 2018-07-07 LAB — TSH: TSH: 1.68 u[IU]/mL (ref 0.40–4.20)

## 2018-07-07 LAB — T3, FREE: T3, Free: 2.9 pg/mL (ref 1.71–3.71)

## 2018-07-07 LAB — VITAMIN B12 AND FOLATE
Folate: 14.5 ng/mL (ref 7.0–19.9)
Vitamin B-12: 230 pg/mL (ref 213–816)

## 2018-07-07 LAB — HEMOGLOBIN A1C: Hgb A1C, %: 5.4 %

## 2018-07-07 MED ORDER — ERGOCALCIFEROL 1.25 MG (50000 UT) PO CAPS
50000.0000 [IU] | ORAL_CAPSULE | ORAL | 0 refills | Status: DC
Start: 2018-07-07 — End: 2018-07-26

## 2018-07-07 MED ORDER — FLUTICASONE PROPIONATE 50 MCG/ACT NA SUSP
NASAL | 1 refills | Status: AC
Start: 2018-07-07 — End: ?

## 2018-07-07 NOTE — Progress Notes (Signed)
Date Specimen Drawn:  07/07/2018   Time Specimen Drawn:  10:03 AM   Test(s) Ordered:  CBC,CMP,LIPID,TSH,VIT D25, CIT B15, A1C, T3 FREE, T4 FREE,    Disposition:  n/a   Patient's Tolerance:  Good   Location Specimen Drawn:  Right antecubital

## 2018-07-07 NOTE — Progress Notes (Signed)
Subjective:    Patient ID: Alicia Barry is a 62 y.o. female.    [name] is in today for an annual gynecological exam.  Her last Pap smear was 07/28/2017.  It was significant for abnormal cells but negative for HPV.  She denies any gynecological concerns today.  She had a normal mammogram in December 2018, normal colonoscopy last year as well.    Medical history significant for hyperlipidemia, vitamin D and vitamin B12 deficiencies, fibromyalgia and depression.    She reports that her psychiatrist started her on Latuda which has helped significantly with her depression but has made it difficult to lose weight despite her efforts of dietary changes and daily exercise.      Review of Systems   Constitutional: Negative.    HENT: Negative.    Respiratory: Negative.    Gastrointestinal: Negative.    Genitourinary: Negative.    Neurological: Negative.    Psychiatric/Behavioral: Negative.            Objective:    Physical Exam  Vitals signs reviewed.   Constitutional:       General: She is not in acute distress.     Appearance: She is obese. She is not ill-appearing.   HENT:      Head: Normocephalic and atraumatic.      Right Ear: Tympanic membrane, ear canal and external ear normal.      Left Ear: Tympanic membrane, ear canal and external ear normal.      Nose: Nose normal.      Mouth/Throat:      Mouth: Mucous membranes are moist.   Eyes:      Extraocular Movements: Extraocular movements intact.      Pupils: Pupils are equal, round, and reactive to light.   Neck:      Musculoskeletal: Normal range of motion and neck supple. No muscular tenderness.   Cardiovascular:      Rate and Rhythm: Normal rate and regular rhythm.      Heart sounds: No murmur.   Pulmonary:      Effort: Pulmonary effort is normal.      Breath sounds: Normal breath sounds.   Chest:      Breasts: Breasts are symmetrical.         Right: Normal. No inverted nipple, mass or nipple discharge.         Left: Normal. No inverted nipple, mass or nipple  discharge.   Abdominal:      General: There is no distension.      Palpations: Abdomen is soft.      Tenderness: There is no tenderness. There is no guarding.   Genitourinary:     General: Normal vulva.      Labia:         Right: No rash, tenderness or lesion.         Left: No rash, tenderness or lesion.       Vagina: Normal.      Cervix: Normal.      Uterus: Normal.       Adnexa: Right adnexa normal and left adnexa normal.      Rectum: Normal. Guaiac result negative.   Musculoskeletal: Normal range of motion.   Lymphadenopathy:      Cervical: No cervical adenopathy.   Skin:     General: Skin is warm and dry.   Neurological:      General: No focal deficit present.      Mental Status: She is alert and oriented  to person, place, and time.   Psychiatric:         Mood and Affect: Mood normal.         Thought Content: Thought content normal.         Judgment: Judgment normal.             Assessment:       1. Encounter for gynecological examination without abnormal finding    2. Vitamin D deficiency    3. Vitamin B12 deficiency    4. Mixed hyperlipidemia    5. Mild intermittent reactive airway disease without complication    6. Abnormal glucose    7. Recurrent major depressive disorder, remission status unspecified    8. Need for prophylactic vaccination and inoculation against influenza          Plan:       Liquid Pap obtained and sent.    Fasting blood work today will include CBC, CMP, lipid panel, TSH/free T3/free T4, vitamin D 25-hydroxy, vitamin B12/folate, hemoglobin A1c.    Flu shot administered today.    Flonase refilled.    Rx: Ergocalciferol 50,000 units weekly ordered.    Defer mammogram until next year.    Follow-up by phone regarding lab results.  If no abnormalities or concerns are noted, I will plan on seeing her back for a physical in 1 year.    Liam Graham FNP    This note was completed using Dragon Medical speech recognition software. Grammatical errors, random word insertions, pronoun errors and  incomplete sentences are occasional consequences of this technology due to software limitations. If there are questions or concerns about the content of this note or information contained within the body of this dictation they should be addressed with the provider for clarification.

## 2018-07-08 LAB — VITAMIN D,25 OH,TOTAL: Vitamin D 25-Hydroxy: 33 ng/mL (ref 30–80)

## 2018-07-24 ENCOUNTER — Encounter (RURAL_HEALTH_CENTER): Payer: Self-pay | Admitting: Family

## 2018-07-26 ENCOUNTER — Other Ambulatory Visit (RURAL_HEALTH_CENTER): Payer: Self-pay | Admitting: Family

## 2018-07-26 DIAGNOSIS — E559 Vitamin D deficiency, unspecified: Secondary | ICD-10-CM

## 2018-07-26 MED ORDER — ERGOCALCIFEROL 1.25 MG (50000 UT) PO CAPS
50000.00 [IU] | ORAL_CAPSULE | ORAL | 3 refills | Status: DC
Start: 2018-07-26 — End: 2019-06-14

## 2018-08-17 ENCOUNTER — Other Ambulatory Visit (RURAL_HEALTH_CENTER): Payer: Self-pay | Admitting: Family

## 2018-08-17 DIAGNOSIS — R053 Chronic cough: Secondary | ICD-10-CM

## 2018-09-13 ENCOUNTER — Ambulatory Visit: Payer: Commercial Managed Care - POS

## 2018-09-13 ENCOUNTER — Other Ambulatory Visit (RURAL_HEALTH_CENTER): Payer: Self-pay | Admitting: Family

## 2018-09-13 DIAGNOSIS — Z23 Encounter for immunization: Secondary | ICD-10-CM

## 2018-09-29 ENCOUNTER — Other Ambulatory Visit (RURAL_HEALTH_CENTER): Payer: Self-pay | Admitting: Family

## 2019-01-19 IMAGING — CR DG HAND COMPLETE 3+V*R*
3 series · 3 of 3 positions shown · non-contrast
Comparison: None.

CLINICAL DATA: Right hand pain after fall yesterday.

EXAM:
RIGHT HAND - COMPLETE 3+ VIEW

[x hand pa right]
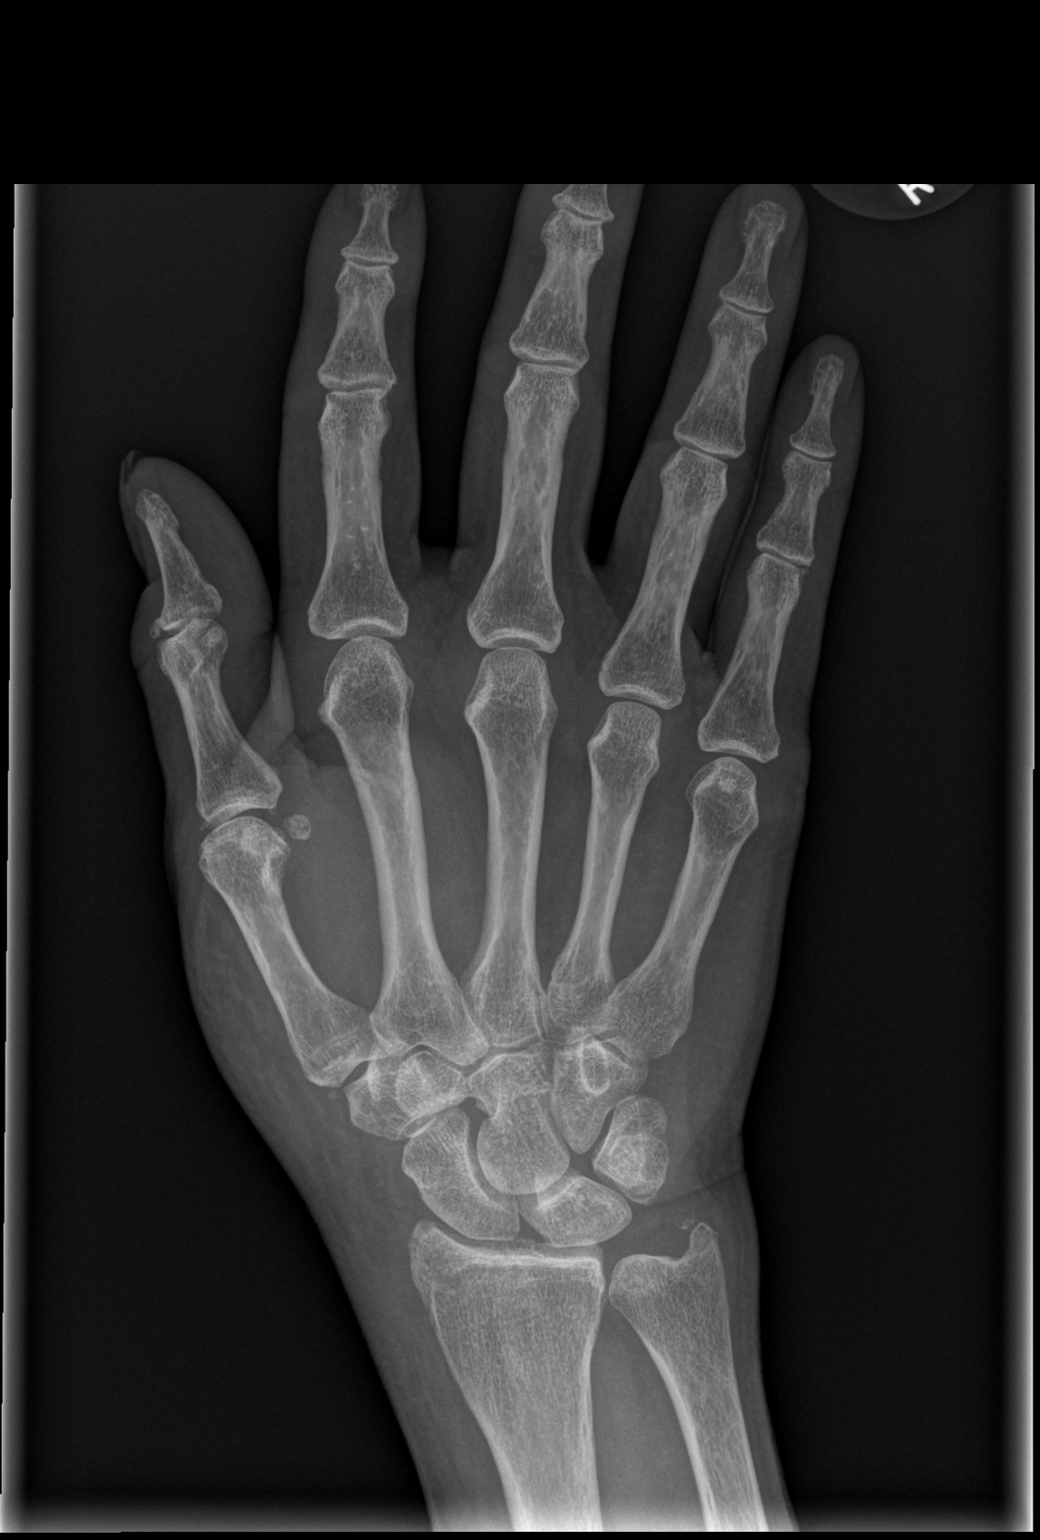

[x hand obl right]
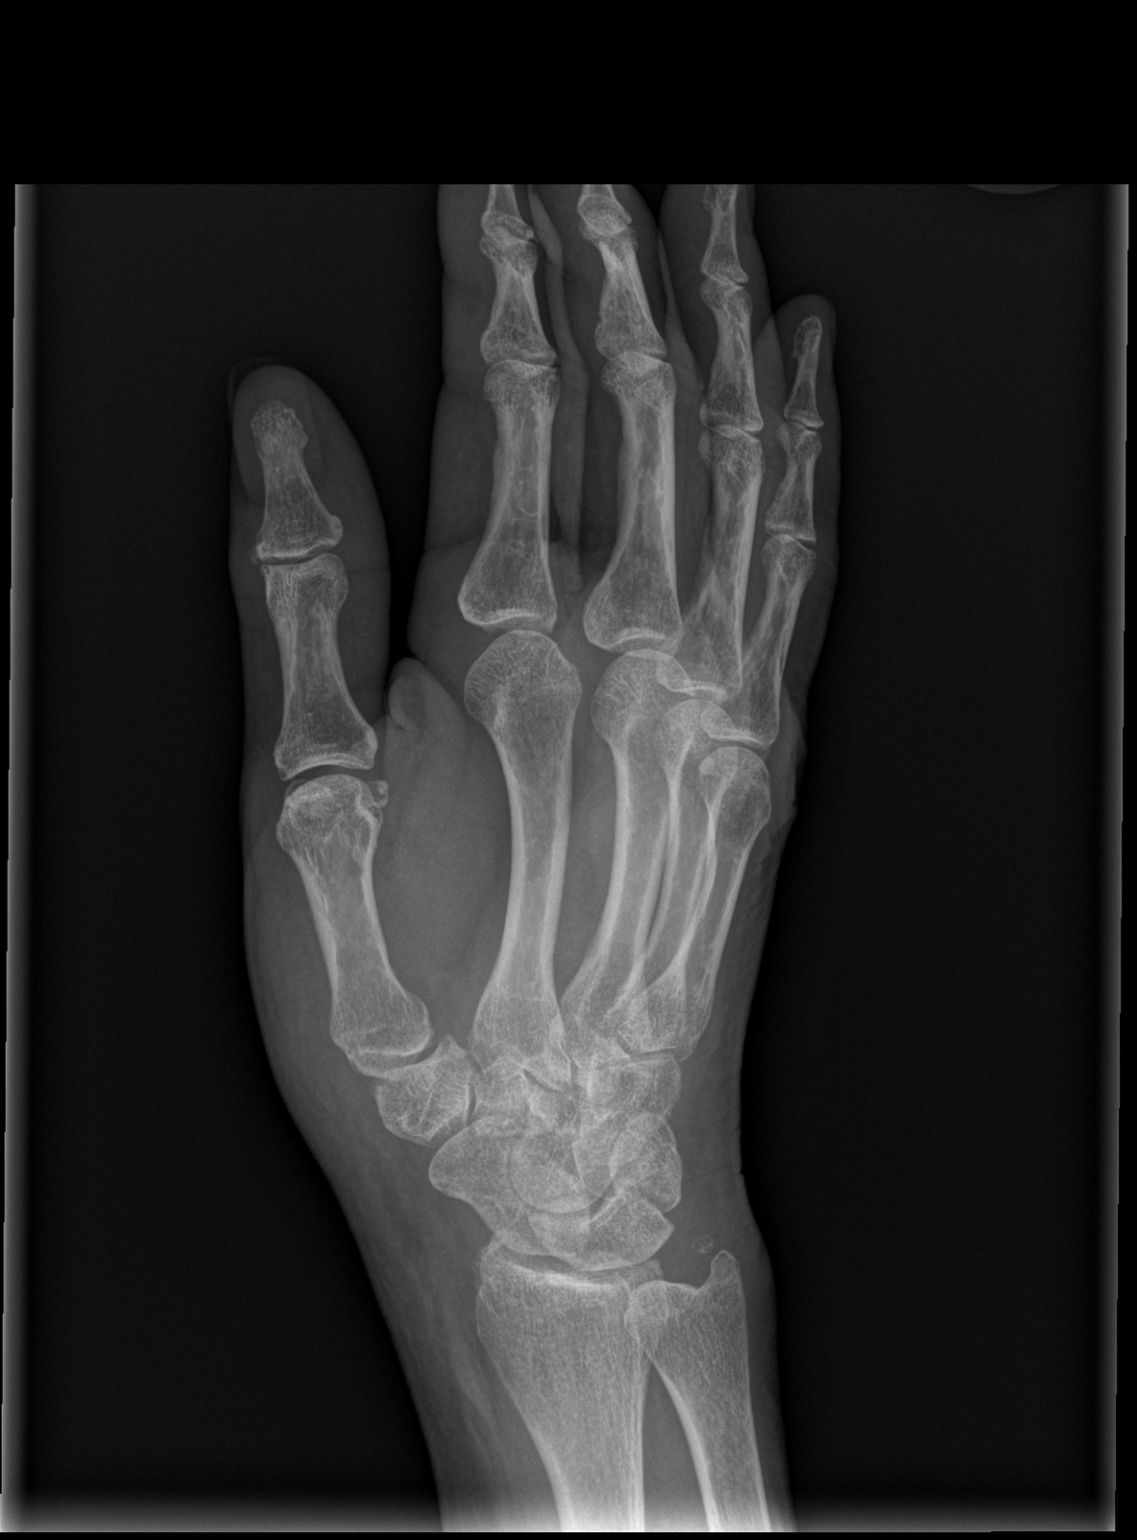

[x hand lat right]
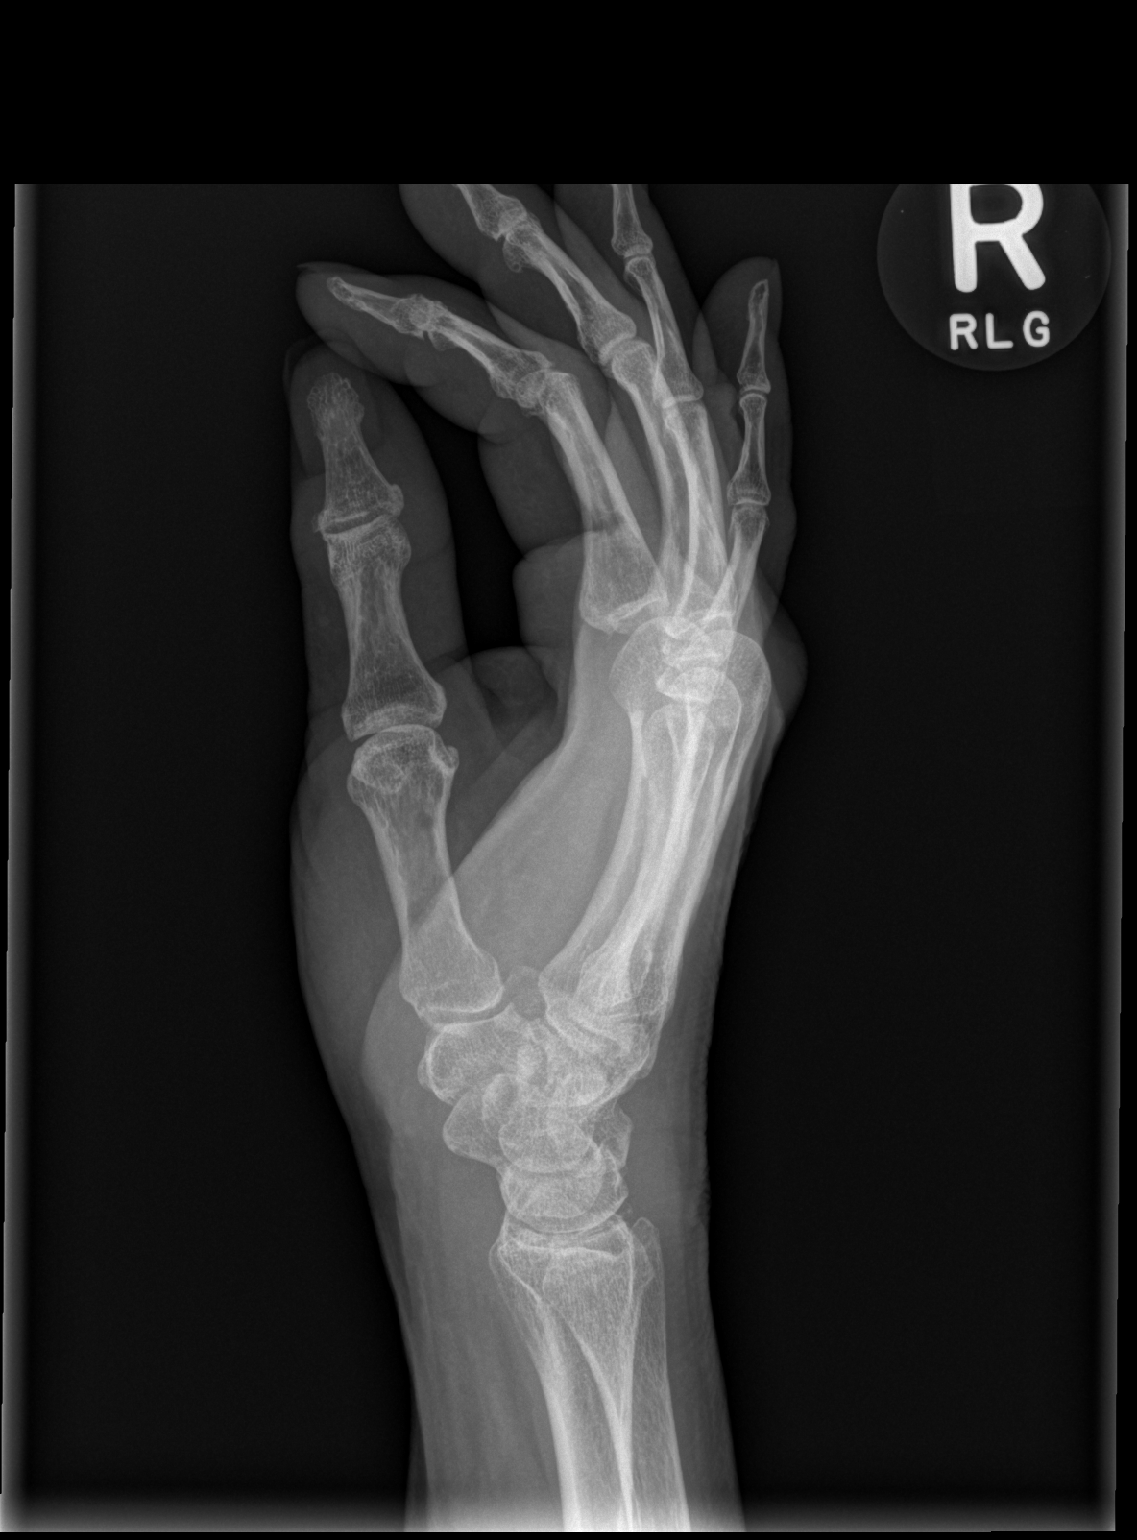

[3 of 3 positions shown; findings below may reference images not displayed]

FINDINGS: There is no evidence of fracture or dislocation. There is no
evidence of arthropathy or other focal bone abnormality. Soft
tissues are unremarkable.
IMPRESSION: Normal right hand.

## 2019-02-23 ENCOUNTER — Ambulatory Visit: Payer: Commercial Managed Care - POS | Attending: Family | Admitting: Family

## 2019-02-23 ENCOUNTER — Encounter (RURAL_HEALTH_CENTER): Payer: Self-pay | Admitting: Family

## 2019-02-23 VITALS — BP 129/64 | HR 80 | Temp 97.2°F | Resp 16 | Ht 65.98 in | Wt 191.2 lb

## 2019-02-23 DIAGNOSIS — H6523 Chronic serous otitis media, bilateral: Secondary | ICD-10-CM

## 2019-02-23 DIAGNOSIS — R42 Dizziness and giddiness: Secondary | ICD-10-CM

## 2019-02-23 MED ORDER — AZELASTINE HCL 0.1 % NA SOLN
2.00 | Freq: Every day | NASAL | 0 refills | Status: DC
Start: 2019-02-23 — End: 2019-12-31

## 2019-02-23 NOTE — Progress Notes (Signed)
[facility]  [facility] [place]    History of Presenting Illness:     Chief Complaint   Patient presents with    Dizziness     x 2 weeks, pressure in left ear started yesterday       Alicia Barry is a 63 y.o. female who presents to the office with concern for vertigo over the past 2 weeks, she is also noticed pressure in her left ear, minor allergy symptoms.  She has been using Flonase daily.    Review of Systems:   ROS  In addition to those noted in history of present illness all other review of systems performed and were negative.    Past Medical History:     Past Medical History:   Diagnosis Date    Anxiety     Arthritis     Depression     Discharge from nipple     Disease of lung     Gastroesophageal reflux disease     Hyperlipidemia        Past Surgical History:     Past Surgical History:   Procedure Laterality Date    ANKLE SURGERY Right 02/02/2016    APPENDECTOMY      BREAST BIOPSY      breast biospy Right     COLONOSCOPY, POLYPECTOMY N/A 11/16/2016    Procedure: COLONOSCOPY, POLYPECTOMY;  Surgeon: Olena Leatherwood, MD;  Location: [facility];  Service: Gastroenterology;  Laterality: N/A;    FRACTURE SURGERY Left     REPLACEMENT TOTAL KNEE Left     SHOULDER ARTHROSCOPY Right     TONSILLECTOMY Bilateral        Family History:     Family History   Problem Relation Age of Onset    Hypertension Mother     Stroke Mother     Osteoporosis Mother     Asthma Mother     Hypertension Father     Diabetes Father     Heart disease Father     Hypertension Sister     Hypertension Sister     Hypertension Sister     Heart attack Brother     Heart attack Paternal Grandmother        Social History:     Social History     Tobacco Use   Smoking Status Former Smoker    Last attempt to quit: 11/10/2001    Years since quitting: 17.2   Smokeless Tobacco Never Used     Social History     Substance and Sexual Activity   Alcohol Use No    Alcohol/week: 0.0  standard drinks     Social History     Substance and Sexual Activity   Drug Use No       Allergies:     Allergies   Allergen Reactions    Ibuprofen Anaphylaxis    Percocet [Oxycodone-Acetaminophen] Itching       Medications:     Prior to Admission medications    Medication Sig Start Date End Date Taking? Authorizing Provider   ALPRAZolam Prudy Feeler) 1 MG tablet Take 1 mg by mouth.Patient takes 1 tablet in the morning and 1 tablet in the evening.       Yes [provider]   Ascorbic Acid (VITAMIN C) 1000 MG tablet Take 1,000 mg by mouth 2 (two) times daily.   Yes [provider]   budesonide-formoterol (SYMBICORT) 80-4.5 MCG/ACT inhaler Inhale 2 puffs into the  lungs 2 (two) times daily. 11/10/16  Yes [name], FNP   Cholecalciferol (VITAMIN D3) 1000 UNIT/SPRAY Liquid Take 1 spray by mouth daily.   Yes [provider]   DULoxetine (CYMBALTA) 60 MG capsule Take 60 mg by mouth daily.   Yes [provider]   ergocalciferol (ERGOCALCIFEROL) 1.25 MG (50000 UT) capsule Take 1 capsule (50,000 Units total) by mouth once a week 07/26/18  Yes [name], FNP   fluticasone [facility]) 50 MCG/ACT nasal spray 2 sprays in each nostril 1-2 times a day 07/07/18  Yes [name], FNP   folic acid (FOLVITE) 1 MG tablet TAKE 1 TABLET DAILY 10/02/18  Yes [name], FNP   LATUDA 40 MG Tab  06/19/18  Yes [provider]   montelukast (SINGULAIR) 10 MG tablet TAKE 1 TABLET NIGHTLY 08/17/18  Yes [name], FNP   Naproxen-Esomeprazole (VIMOVO) 500-20 MG Tablet Delayed Response Take 1 tablet by mouth daily as needed.   Yes [provider]   pantoprazole (PROTONIX) 40 MG tablet TAKE 1 TABLET DAILY 08/17/18  Yes [name], FNP   simvastatin (ZOCOR) 20 MG tablet TAKE 1 TABLET NIGHTLY 07/06/18  Yes [name], FNP   vitamin B-12 (CYANOCOBALAMIN) 1000 MCG tablet Take 1,000 mcg by mouth daily.   Yes [provider]               Physical Exam:     Vitals:    02/23/19 1132   BP: 129/64    Pulse: 80   Resp: 16   Temp: 97.2 F (36.2 C)   SpO2: 97%     Body mass index is 30.88 kg/m.    Physical Exam  Vitals signs reviewed.   Constitutional:       General: She is not in acute distress.     Appearance: Normal appearance. She is not ill-appearing.   HENT:      Head: Normocephalic and atraumatic.      Right Ear: Hearing, ear canal and external ear normal. A middle ear effusion is present. Tympanic membrane is not erythematous.      Left Ear: Hearing, ear canal and external ear normal. A middle ear effusion is present. Tympanic membrane is not erythematous.      Nose: Mucosal edema (Without erythema) present.      Mouth/Throat:      Pharynx: Oropharynx is clear.   Musculoskeletal:      Comments: Mild shaking of the left leg noted throughout the visit, consistent with tardive dyskinesia.   Neurological:      Mental Status: She is alert and oriented to person, place, and time.   Psychiatric:         Mood and Affect: Mood normal.         Behavior: Behavior normal.         Thought Content: Thought content normal.         Judgment: Judgment normal.         Assessment:     1. Vertigo     2. Bilateral chronic serous otitis media         Plan:   Rx: Astelin nasal spray, 2 sprays in each nostril daily.    Continue Flonase at the opposite time of the day.    If no improvement within the next week, will refer to physical therapy for manipulation.    Signed by [name] FNP    This note was completed using Dragon Medical speech recognition software. Grammatical errors,  random word insertions, pronoun errors and incomplete sentences are occasional consequences of this technology due to software limitations. If there are questions or concerns about the content of this note or information contained within the body of this dictation they should be addressed with the provider for clarification.

## 2019-03-08 ENCOUNTER — Telehealth (RURAL_HEALTH_CENTER): Payer: Self-pay | Admitting: Family

## 2019-03-08 DIAGNOSIS — R42 Dizziness and giddiness: Secondary | ICD-10-CM

## 2019-03-08 NOTE — Telephone Encounter (Signed)
Pt left VM stating she believes the vertigo is coming back and was told to call the office if this happened.

## 2019-03-08 NOTE — Addendum Note (Signed)
Addended by: [name] H on: 03/08/2019 05:04 PM     Modules accepted: Orders

## 2019-03-08 NOTE — Telephone Encounter (Signed)
Please see note.

## 2019-03-08 NOTE — Telephone Encounter (Signed)
As we discussed in our visit, I will refer to PT for manipulation.

## 2019-03-20 ENCOUNTER — Ambulatory Visit: Payer: Commercial Managed Care - POS | Attending: Family

## 2019-03-20 DIAGNOSIS — H8112 Benign paroxysmal vertigo, left ear: Secondary | ICD-10-CM | POA: Insufficient documentation

## 2019-03-20 DIAGNOSIS — R262 Difficulty in walking, not elsewhere classified: Secondary | ICD-10-CM | POA: Insufficient documentation

## 2019-04-02 ENCOUNTER — Ambulatory Visit: Payer: Commercial Managed Care - POS | Attending: Family

## 2019-04-02 DIAGNOSIS — R262 Difficulty in walking, not elsewhere classified: Secondary | ICD-10-CM | POA: Insufficient documentation

## 2019-04-02 DIAGNOSIS — H8112 Benign paroxysmal vertigo, left ear: Secondary | ICD-10-CM | POA: Insufficient documentation

## 2019-05-01 ENCOUNTER — Ambulatory Visit: Payer: Commercial Managed Care - POS

## 2019-06-14 ENCOUNTER — Other Ambulatory Visit (RURAL_HEALTH_CENTER): Payer: Self-pay | Admitting: Family

## 2019-06-14 DIAGNOSIS — E559 Vitamin D deficiency, unspecified: Secondary | ICD-10-CM

## 2019-08-30 ENCOUNTER — Other Ambulatory Visit (RURAL_HEALTH_CENTER): Payer: Self-pay | Admitting: Family

## 2019-08-30 DIAGNOSIS — E782 Mixed hyperlipidemia: Secondary | ICD-10-CM

## 2019-10-22 ENCOUNTER — Other Ambulatory Visit (RURAL_HEALTH_CENTER): Payer: Self-pay | Admitting: Family

## 2019-10-22 DIAGNOSIS — R053 Chronic cough: Secondary | ICD-10-CM

## 2019-10-24 ENCOUNTER — Telehealth (RURAL_HEALTH_CENTER): Payer: Self-pay

## 2019-10-24 NOTE — Telephone Encounter (Signed)
Will work in tomorrow morning and also do FBW.

## 2019-10-24 NOTE — Telephone Encounter (Signed)
Patient has lower back pain and had a little blood in her urine Saturday and again yesterday. She feels it may be UTI. TK MA

## 2019-10-25 ENCOUNTER — Ambulatory Visit: Payer: Commercial Managed Care - POS | Attending: Family | Admitting: Family

## 2019-10-25 ENCOUNTER — Other Ambulatory Visit
Admission: RE | Admit: 2019-10-25 | Discharge: 2019-10-25 | Disposition: A | Discharge status: Home / Self Care | Payer: Commercial Managed Care - POS | Source: Ambulatory Visit | Attending: Family | Admitting: Family

## 2019-10-25 ENCOUNTER — Encounter (RURAL_HEALTH_CENTER): Payer: Self-pay | Admitting: Family

## 2019-10-25 VITALS — BP 125/89 | HR 74 | Temp 98.7°F | Resp 16 | Ht 65.98 in | Wt 202.6 lb

## 2019-10-25 DIAGNOSIS — N3001 Acute cystitis with hematuria: Secondary | ICD-10-CM

## 2019-10-25 DIAGNOSIS — F339 Major depressive disorder, recurrent, unspecified: Secondary | ICD-10-CM

## 2019-10-25 DIAGNOSIS — E538 Deficiency of other specified B group vitamins: Secondary | ICD-10-CM

## 2019-10-25 DIAGNOSIS — E559 Vitamin D deficiency, unspecified: Secondary | ICD-10-CM

## 2019-10-25 DIAGNOSIS — Z23 Encounter for immunization: Secondary | ICD-10-CM

## 2019-10-25 DIAGNOSIS — E782 Mixed hyperlipidemia: Secondary | ICD-10-CM

## 2019-10-25 LAB — VH POCT URINALYSIS (DIPSTICK)
Bilirubin, UA POCT: NEGATIVE
Glucose, UA POCT: NEGATIVE mg/dL
Ketones, UA POCT: NEGATIVE mg/dL
Nitrite, UA POCT: NEGATIVE
POCT Spec Gravity, UA: 1.015 (ref 1.001–1.035)
POCT pH, UA: 7 (ref 5–8)
Protein, UA POCT: NEGATIVE mg/dL
Urobilinogen, UA: 0.2 mg/dL

## 2019-10-25 MED ORDER — CIPROFLOXACIN HCL 500 MG PO TABS
500.00 mg | ORAL_TABLET | Freq: Two times a day (BID) | ORAL | 0 refills | Status: AC
Start: 2019-10-25 — End: 2019-11-01

## 2019-10-25 NOTE — Progress Notes (Signed)
[facility]  [facility] [place]    History of Presenting Illness:     Chief Complaint   Patient presents with    Urinary Tract Infection Symptoms       Alicia Barry is a 64 y.o. female who presents to the office with complaints of hematuria and pelvic discomfort which started 5 days ago.  She denies pain with urination.    [name] has a history of severe depression and is working with a psychiatrist who she reports is weaning her off of Latuda and has recently added Cogentin at bedtime.  She is sleeping well.  She reports that her mood is fairly stable and she feels that her depression may be proving.    Review of Systems:   ROS  In addition to those noted in history of present illness all other review of systems performed and were negative.    Past Medical History:     Past Medical History:   Diagnosis Date    Anxiety     Arthritis     Depression     Discharge from nipple     Disease of lung     Gastroesophageal reflux disease     Hyperlipidemia        Past Surgical History:     Past Surgical History:   Procedure Laterality Date    ANKLE SURGERY Right 02/02/2016    APPENDECTOMY      BREAST BIOPSY      breast biospy Right     COLONOSCOPY, POLYPECTOMY N/A 11/16/2016    Procedure: COLONOSCOPY, POLYPECTOMY;  Surgeon: Olena Leatherwood, MD;  [facility];  Service: Gastroenterology;  Laterality: N/A;    FRACTURE SURGERY Left     REPLACEMENT TOTAL KNEE Left     SHOULDER ARTHROSCOPY Right     TONSILLECTOMY Bilateral        Family History:     Family History   Problem Relation Age of Onset    Hypertension Mother     Stroke Mother     Osteoporosis Mother     Asthma Mother     Hypertension Father     Diabetes Father     Heart disease Father     Hypertension Sister     Hypertension Sister     Hypertension Sister     Heart attack Brother     Heart attack Paternal Grandmother        Social History:     Social History     Tobacco Use   Smoking  Status Former Smoker    Quit date: 11/10/2001    Years since quitting: 17.9   Smokeless Tobacco Never Used     Social History     Substance and Sexual Activity   Alcohol Use No    Alcohol/week: 0.0 standard drinks     Social History     Substance and Sexual Activity   Drug Use No       Allergies:     Allergies   Allergen Reactions    Ibuprofen Anaphylaxis    Percocet [Oxycodone-Acetaminophen] Itching       Medications:     Prior to Admission medications    Medication Sig Start Date End Date Taking? Authorizing Provider   ALPRAZolam Prudy Feeler) 1 MG tablet Take 1 mg by mouth.Patient takes 1 tablet in the morning and 1 tablet in the evening.       Yes [provider]   Ascorbic  Acid (VITAMIN C) 1000 MG tablet Take 1,000 mg by mouth 2 (two) times daily.   Yes [provider]   azelastine (ASTELIN) 0.1 % nasal spray 2 sprays by Nasal route daily Use in each nostril as directed 02/23/19  Yes [name], FNP   benztropine (COGENTIN) 0.5 MG tablet Take 0.5 mg by mouth daily    09/04/19  Yes [provider]   budesonide-formoterol (SYMBICORT) 80-4.5 MCG/ACT inhaler Inhale 2 puffs into the lungs 2 (two) times daily. 11/10/16  Yes [name], FNP   Cholecalciferol (VITAMIN D3) 1000 UNIT/SPRAY Liquid Take 1 spray by mouth daily.   Yes [provider]   DULoxetine (CYMBALTA) 60 MG capsule Take 60 mg by mouth daily.   Yes [provider]   fluticasone (FLONASE) 50 MCG/ACT nasal spray 2 sprays in each nostril 1-2 times a day 07/07/18  Yes [name], FNP   folic acid (FOLVITE) 1 MG tablet TAKE 1 TABLET DAILY 10/24/19  Yes [name], FNP   montelukast (SINGULAIR) 10 MG tablet TAKE 1 TABLET NIGHTLY 10/24/19  Yes [name], FNP   pantoprazole (PROTONIX) 40 MG tablet TAKE 1 TABLET DAILY 08/17/18  Yes [name], FNP   simvastatin (ZOCOR) 20 MG tablet TAKE 1 TABLET NIGHTLY 08/30/19  Yes [name], FNP   vitamin B-12 (CYANOCOBALAMIN) 1000 MCG tablet Take 1,000 mcg by mouth daily.   Yes  [provider]   vitamin D, ergocalciferol, (DRISDOL) 50000 UNIT Cap TAKE 1 CAPSULE ONCE A WEEK 06/14/19  Yes [name], FNP   LATUDA 40 MG Tab  06/19/18 10/25/19 Yes [provider]   Naproxen-Esomeprazole (VIMOVO) 500-20 MG Tablet Delayed Response Take 1 tablet by mouth daily as needed.  10/25/19 Yes [provider]   ciprofloxacin (Cipro) 500 MG tablet Take 1 tablet (500 mg total) by mouth 2 (two) times daily for 7 days 10/25/19 11/01/19  [name], FNP       Physical Exam:     Vitals:    10/25/19 0941   BP: 125/89   Pulse: 74   Resp: 16   Temp: 98.7 F (37.1 C)   SpO2: 99%     Body mass index is 32.72 kg/m.    Physical Exam  Vitals signs reviewed.   Constitutional:       General: She is not in acute distress.     Appearance: Normal appearance. She is obese. She is not ill-appearing.      Comments: Weight is up 11 pounds.   HENT:      Head: Normocephalic and atraumatic.   Eyes:      Extraocular Movements: Extraocular movements intact.   Neck:      Musculoskeletal: Normal range of motion and neck supple. No neck rigidity or muscular tenderness.   Cardiovascular:      Rate and Rhythm: Normal rate and regular rhythm.      Heart sounds: Normal heart sounds. No murmur.   Pulmonary:      Effort: Pulmonary effort is normal. No respiratory distress.      Breath sounds: Normal breath sounds. No wheezing.   Abdominal:      Tenderness: There is abdominal tenderness (Right anterior flank and suprapubic tenderness with palpation). There is right CVA tenderness. There is no left CVA tenderness.   Musculoskeletal: Normal range of motion.   Lymphadenopathy:      Cervical: No cervical adenopathy.   Skin:     General: Skin is warm and dry.   Neurological:  General: No focal deficit present.      Mental Status: She is alert and oriented to person, place, and time.   Psychiatric:         Mood and Affect: Mood normal.         Behavior: Behavior normal.         Thought Content: Thought content normal.          Judgment: Judgment normal.         Assessment:     1. Mixed hyperlipidemia  Comprehensive metabolic panel    Lipid panel   2. Recurrent major depressive disorder, remission status unspecified  CBC and differential    TSH    T4, free    T3, free    Hemoglobin A1C   3. Vitamin D deficiency  Vitamin D,25 OH, Total   4. Vitamin B12 deficiency  Vitamin B12 And Folate   5. Folic acid deficiency  Vitamin B12 And Folate   6. Acute cystitis with hematuria  POCT UA Dipstick    Urine Culture    ciprofloxacin (Cipro) 500 MG tablet   7. Need for influenza vaccination  Flu vacc QUAD PF 6 MOS & UP (FLULAVAL/FlUARIX)   8. Need for pneumococcal vaccination  Pneumococcal conjugate vaccine 13-valent less than 5yo IM       Plan:   Urine dip is significant for large amount of blood and trace leukocytes.  Urine culture ordered.    Fasting blood work today will include CBC, CMP, lipid panel, TSH/free T3/free T4, vitamin D 25-hydroxy, vitamin B12/folate, hemoglobin A1c.    Rx: Cipro 500 mg twice daily x7 days.    Follow-up by phone regarding lab results.    Return for routine checkup in 6 months, sooner if needed.    Signed by [name] FNP.    This note was completed using Physicist, medical. Grammatical errors, random word insertions, pronoun errors and incomplete sentences are occasional consequences of this technology due to software limitations. If there are questions or concerns about the content of this note or information contained within the body of this dictation they should be addressed with the provider for clarification.

## 2019-10-25 NOTE — Progress Notes (Signed)
Date Specimen Drawn:  10/25/2019   Time Specimen Drawn:  10:33 AM   Test(s) Ordered:  URINE CULTURE, A1C, VIT B12/FOLATE, VIT D 25, T3 FREE, T4 FREE, TSH, LIPID, CMP,CBC   Disposition:  n/a   Patient's Tolerance:  Good   Location Specimen Drawn:  Right antecubital

## 2019-10-26 LAB — CBC AND DIFFERENTIAL
Basophils %: 1.2 % (ref 0.0–3.0)
Basophils Absolute: 0.1 10*3/uL (ref 0.0–0.3)
Eosinophils %: 1.7 % (ref 0.0–7.0)
Eosinophils Absolute: 0.1 10*3/uL (ref 0.0–0.8)
Hematocrit: 49.6 % — ABNORMAL HIGH (ref 36.0–48.0)
Hemoglobin: 15.3 gm/dL (ref 12.0–16.0)
Lymphocytes Absolute: 2.7 10*3/uL (ref 0.6–5.1)
Lymphocytes: 31.1 % (ref 15.0–46.0)
MCH: 31 pg (ref 28–35)
MCHC: 31 gm/dL — ABNORMAL LOW (ref 32–36)
MCV: 100 fL (ref 80–100)
MPV: 8.6 fL (ref 6.0–10.0)
Monocytes Absolute: 0.4 10*3/uL (ref 0.1–1.7)
Monocytes: 4.9 % (ref 3.0–15.0)
Neutrophils %: 61.1 % (ref 42.0–78.0)
Neutrophils Absolute: 5.3 10*3/uL (ref 1.7–8.6)
PLT CT: 241 10*3/uL (ref 130–440)
RBC: 4.97 10*6/uL (ref 3.80–5.00)
RDW: 12.7 % (ref 11.0–14.0)
WBC: 8.7 10*3/uL (ref 4.0–11.0)

## 2019-10-26 LAB — LIPID PANEL
Cholesterol: 191 mg/dL (ref 75–199)
Coronary Heart Disease Risk: 4.24
HDL: 45 mg/dL (ref 45–65)
LDL Calculated: 126 mg/dL
Triglycerides: 101 mg/dL (ref 10–150)
VLDL: 20 (ref 0–40)

## 2019-10-26 LAB — COMPREHENSIVE METABOLIC PANEL
ALT: 9 U/L (ref 0–55)
AST (SGOT): 14 U/L (ref 10–42)
Albumin/Globulin Ratio: 1.31 Ratio (ref 0.80–2.00)
Albumin: 3.8 gm/dL (ref 3.5–5.0)
Alkaline Phosphatase: 85 U/L (ref 40–145)
Anion Gap: 15.4 mMol/L (ref 7.0–18.0)
BUN / Creatinine Ratio: 14.3 Ratio (ref 10.0–30.0)
BUN: 12 mg/dL (ref 7–22)
Bilirubin, Total: 0.5 mg/dL (ref 0.1–1.2)
CO2: 29 mMol/L (ref 20–30)
Calcium: 8.9 mg/dL (ref 8.5–10.5)
Chloride: 102 mMol/L (ref 98–110)
Creatinine: 0.84 mg/dL (ref 0.60–1.20)
EGFR: 74 mL/min/{1.73_m2} (ref 60–150)
Globulin: 2.9 gm/dL (ref 2.0–4.0)
Glucose: 102 mg/dL — ABNORMAL HIGH (ref 71–99)
Osmolality Calculated: 283 mOsm/kg (ref 275–300)
Potassium: 4.4 mMol/L (ref 3.5–5.3)
Protein, Total: 6.7 gm/dL (ref 6.0–8.3)
Sodium: 142 mMol/L (ref 136–147)

## 2019-10-26 LAB — T3, FREE: T3, Free: 3.3 pg/mL (ref 1.71–3.71)

## 2019-10-26 LAB — VITAMIN B12 AND FOLATE
Folate: 15.6 ng/mL (ref 7.0–19.9)
Vitamin B-12: 217 pg/mL (ref 213–816)

## 2019-10-26 LAB — VITAMIN D,25 OH,TOTAL: Vitamin D 25-Hydroxy: 34.9 ng/mL (ref 30.0–80.0)

## 2019-10-26 LAB — HEMOGLOBIN A1C: Hgb A1C, %: 5.5 %

## 2019-10-26 LAB — T4, FREE: T4 Free: 0.86 ng/dL (ref 0.70–1.48)

## 2019-10-26 LAB — TSH: TSH: 4.17 u[IU]/mL (ref 0.40–4.20)

## 2019-10-29 ENCOUNTER — Telehealth (RURAL_HEALTH_CENTER): Payer: Self-pay

## 2019-10-29 DIAGNOSIS — R109 Unspecified abdominal pain: Secondary | ICD-10-CM

## 2019-10-29 DIAGNOSIS — R319 Hematuria, unspecified: Secondary | ICD-10-CM

## 2019-10-29 NOTE — Telephone Encounter (Signed)
The urine culture was negative for bacteria.  I am going to put in an order for a CT of her abdomen and pelvis.

## 2019-10-29 NOTE — Telephone Encounter (Signed)
Patient is still having lower abdominal pain (feels like a period) and blood in urine. She asks if this should have cleared up by now being on the antibiotics. TK MA

## 2019-10-30 NOTE — Telephone Encounter (Signed)
Patient is aware and has scheduled CT at [facility]. TK MA

## 2019-11-02 ENCOUNTER — Other Ambulatory Visit (RURAL_HEALTH_CENTER): Payer: Self-pay | Admitting: Family

## 2019-11-08 ENCOUNTER — Ambulatory Visit
Admission: RE | Admit: 2019-11-08 | Discharge: 2019-11-08 | Disposition: A | Discharge status: Home / Self Care | Payer: Commercial Managed Care - POS | Source: Ambulatory Visit | Attending: Family | Admitting: Family

## 2019-11-08 ENCOUNTER — Encounter (RURAL_HEALTH_CENTER): Payer: Self-pay | Admitting: Family

## 2019-11-08 DIAGNOSIS — N202 Calculus of kidney with calculus of ureter: Secondary | ICD-10-CM | POA: Insufficient documentation

## 2019-11-08 DIAGNOSIS — R109 Unspecified abdominal pain: Secondary | ICD-10-CM | POA: Insufficient documentation

## 2019-11-08 DIAGNOSIS — R319 Hematuria, unspecified: Secondary | ICD-10-CM | POA: Insufficient documentation

## 2019-11-09 ENCOUNTER — Other Ambulatory Visit (RURAL_HEALTH_CENTER): Payer: Self-pay | Admitting: Family

## 2019-11-09 DIAGNOSIS — N200 Calculus of kidney: Secondary | ICD-10-CM

## 2019-11-22 ENCOUNTER — Other Ambulatory Visit (RURAL_HEALTH_CENTER): Payer: Self-pay | Admitting: Family

## 2019-11-22 DIAGNOSIS — E782 Mixed hyperlipidemia: Secondary | ICD-10-CM

## 2019-12-31 ENCOUNTER — Ambulatory Visit: Payer: Commercial Managed Care - POS | Attending: Family | Admitting: Family

## 2019-12-31 ENCOUNTER — Other Ambulatory Visit
Admission: RE | Admit: 2019-12-31 | Discharge: 2019-12-31 | Disposition: A | Discharge status: Home / Self Care | Payer: Commercial Managed Care - POS | Source: Ambulatory Visit | Attending: Family | Admitting: Family

## 2019-12-31 ENCOUNTER — Encounter (RURAL_HEALTH_CENTER): Payer: Self-pay | Admitting: Family

## 2019-12-31 VITALS — BP 160/92 | HR 95 | Temp 98.4°F | Resp 16 | Ht 65.98 in | Wt 196.1 lb

## 2019-12-31 DIAGNOSIS — L749 Eccrine sweat disorder, unspecified: Secondary | ICD-10-CM

## 2019-12-31 DIAGNOSIS — I10 Essential (primary) hypertension: Secondary | ICD-10-CM | POA: Insufficient documentation

## 2019-12-31 MED ORDER — LOSARTAN POTASSIUM 25 MG PO TABS
25.0000 mg | ORAL_TABLET | Freq: Every day | ORAL | 0 refills | Status: DC
Start: 2019-12-31 — End: 2020-01-24

## 2019-12-31 NOTE — Progress Notes (Signed)
Date Specimen Drawn:  12/31/2019   Time Specimen Drawn:  2:30 PM   Test(s) Ordered:  TSH T 4 T 3 1 GOLD   Disposition:  n/a   Patient's Tolerance:  Good   Location Specimen Drawn:  Right antecubital

## 2019-12-31 NOTE — Progress Notes (Signed)
[facility]  [facility] [place]    History of Presenting Illness:     Chief Complaint   Patient presents with    Hypertension     patient states urologost told her B/P was a little high a few mths ago  when she had a kidney stone which, has readings with her        Alicia Barry is a 64 y.o. female who presents to the office with concern for elevated blood pressure.  She has been monitoring her readings at home which range from 125-197/77-104.  She has been experiencing a pressure type sensation in her forehead but denies dizziness or blurred vision.    Additionally, she complains of increased saliva.  She has also been experiencing early morning hot flashes and sweating which is new as she has been postmenopausal x15 years.    Review of Systems:   ROS  In addition to those noted in history of present illness all other review of systems performed and were negative.    Past Medical History:     Past Medical History:   Diagnosis Date    Anxiety     Arthritis     Depression     Discharge from nipple     Disease of lung     Gastroesophageal reflux disease     Hyperlipidemia        Past Surgical History:     Past Surgical History:   Procedure Laterality Date    ANKLE SURGERY Right 02/02/2016    APPENDECTOMY      BREAST BIOPSY      breast biospy Right     COLONOSCOPY, POLYPECTOMY N/A 11/16/2016    Procedure: COLONOSCOPY, POLYPECTOMY;  Surgeon: Olena Leatherwood, MD;  [facility];  Service: Gastroenterology;  Laterality: N/A;    FRACTURE SURGERY Left     REPLACEMENT TOTAL KNEE Left     SHOULDER ARTHROSCOPY Right     TONSILLECTOMY Bilateral        Family History:     Family History   Problem Relation Age of Onset    Hypertension Mother     Stroke Mother     Osteoporosis Mother     Asthma Mother     Hypertension Father     Diabetes Father     Heart disease Father     Hypertension Sister     Hypertension Sister     Hypertension Sister      Heart attack Brother     Heart attack Paternal Grandmother        Social History:     Social History     Tobacco Use   Smoking Status Former Smoker    Quit date: 11/10/2001    Years since quitting: 18.1   Smokeless Tobacco Never Used     Social History     Substance and Sexual Activity   Alcohol Use No    Alcohol/week: 0.0 standard drinks     Social History     Substance and Sexual Activity   Drug Use No       Allergies:     Allergies   Allergen Reactions    Ibuprofen Anaphylaxis    Percocet [Oxycodone-Acetaminophen] Itching       Medications:     Prior to Admission medications    Medication Sig Start Date End Date Taking? Authorizing Provider   ALPRAZolam Prudy Feeler) 1 MG tablet Take 1 mg by mouth.Patient takes 1  tablet in the morning and 1 tablet in the evening.       Yes [provider]   Ascorbic Acid (VITAMIN C) 1000 MG tablet Take 1,000 mg by mouth 2 (two) times daily.   Yes [provider]   budesonide-formoterol (SYMBICORT) 80-4.5 MCG/ACT inhaler Inhale 2 puffs into the lungs 2 (two) times daily. 11/10/16  Yes [name], FNP   Cholecalciferol (VITAMIN D3) 1000 UNIT/SPRAY Liquid Take 1 spray by mouth daily.   Yes [provider]   DULoxetine (CYMBALTA) 60 MG capsule Take 60 mg by mouth daily.   Yes [provider]   fluticasone (FLONASE) 50 MCG/ACT nasal spray 2 sprays in each nostril 1-2 times a day 07/07/18  Yes [name], FNP   folic acid (FOLVITE) 1 MG tablet TAKE 1 TABLET DAILY 11/05/19  Yes [name], FNP   montelukast (SINGULAIR) 10 MG tablet TAKE 1 TABLET NIGHTLY 10/24/19  Yes [name], FNP   pantoprazole (PROTONIX) 40 MG tablet TAKE 1 TABLET DAILY 11/05/19  Yes [name], FNP   simvastatin (ZOCOR) 20 MG tablet TAKE 1 TABLET NIGHTLY 11/23/19  Yes Philipp Deputy, NP   vitamin B-12 (CYANOCOBALAMIN) 1000 MCG tablet Take 1,000 mcg by mouth daily.   Yes [provider]   vitamin D, ergocalciferol, (DRISDOL) 50000 UNIT Cap TAKE 1 CAPSULE ONCE A WEEK 06/14/19   Yes [name], FNP   losartan (Cozaar) 25 MG tablet Take 1 tablet (25 mg total) by mouth daily 12/31/19   [name], FNP   azelastine (ASTELIN) 0.1 % nasal spray 2 sprays by Nasal route daily Use in each nostril as directed 02/23/19 12/31/19  [name], FNP   benztropine (COGENTIN) 0.5 MG tablet Take 0.5 mg by mouth daily    09/04/19 12/31/19  [provider]       Physical Exam:   BP (!) 160/92    Pulse 95    Temp 98.4 F (36.9 C) (Temporal)    Resp 16    Ht 1.676 m (5' 5.98")    Wt 89 kg (196 lb 1.6 oz)    SpO2 97%    BMI 31.67 kg/m     Physical Exam  Constitutional:  Well developed, well nourished, no acute distress, non-toxic appearance   Eyes: EOM normal   HENT:  Atraumatic. Neck- normal range of motion, no tenderness, supple   Respiratory:  No respiratory distress, normal breath sounds, no rales, no wheezing   Cardiovascular:  Normal rate, normal rhythm, no murmurs, no gallops, no rubs.  Blood pressure consistent in both arms.  Musculoskeletal:  No impaired range of motion   Integument:  Well hydrated, no rash   Lymphatic:  No lymphadenopathy noted   Neurologic:  Alert & oriented x 3, normal motor function, normal sensory function, no focal deficits noted   Psychiatric:  Speech and behavior appropriate  Assessment:     1. Essential hypertension  losartan (Cozaar) 25 MG tablet   2. Sweating abnormality  TSH    T4, free    T3, free       Plan:   Rx: Losartan 25 mg to be taken daily.    Labs today to recheck TSH/free T3/free T4.    I will follow-up with Steward Drone by phone regarding lab results.  Return for an office nurses visit to recheck blood pressure in 2 weeks.  She is instructed to bring her blood pressure monitor with her as well for comparison.    Signed by  [name] FNP    This note was completed using Dragon Medical speech recognition software. Grammatical errors, random word insertions, pronoun errors and incomplete sentences are occasional consequences of this technology due to software  limitations. If there are questions or concerns about the content of this note or information contained within the body of this dictation they should be addressed with the provider for clarification.

## 2020-01-01 LAB — T3, FREE: T3, Free: 3.3 pg/mL (ref 1.71–3.71)

## 2020-01-01 LAB — TSH: TSH: 1.02 u[IU]/mL (ref 0.40–4.20)

## 2020-01-01 LAB — T4, FREE: T4 Free: 0.87 ng/dL (ref 0.70–1.48)

## 2020-01-07 ENCOUNTER — Encounter (RURAL_HEALTH_CENTER): Payer: Self-pay

## 2020-01-14 ENCOUNTER — Ambulatory Visit: Payer: Commercial Managed Care - POS

## 2020-01-18 ENCOUNTER — Other Ambulatory Visit (RURAL_HEALTH_CENTER): Payer: Self-pay | Admitting: Family

## 2020-01-18 DIAGNOSIS — R053 Chronic cough: Secondary | ICD-10-CM

## 2020-01-24 ENCOUNTER — Other Ambulatory Visit (RURAL_HEALTH_CENTER): Payer: Self-pay

## 2020-01-24 DIAGNOSIS — I10 Essential (primary) hypertension: Secondary | ICD-10-CM

## 2020-01-24 MED ORDER — LOSARTAN POTASSIUM 25 MG PO TABS
25.0000 mg | ORAL_TABLET | Freq: Every day | ORAL | 3 refills | Status: DC
Start: 2020-01-24 — End: 2020-12-10

## 2020-01-24 NOTE — Telephone Encounter (Signed)
Send to mail order pharmacy TK MA

## 2020-02-25 ENCOUNTER — Encounter (RURAL_HEALTH_CENTER): Payer: Self-pay | Admitting: Family

## 2020-02-25 ENCOUNTER — Ambulatory Visit: Payer: Commercial Managed Care - POS | Attending: Family | Admitting: Family

## 2020-02-25 VITALS — BP 149/83 | HR 78 | Temp 97.5°F | Resp 16 | Ht 65.98 in | Wt 194.1 lb

## 2020-02-25 DIAGNOSIS — J0100 Acute maxillary sinusitis, unspecified: Secondary | ICD-10-CM

## 2020-02-25 DIAGNOSIS — J189 Pneumonia, unspecified organism: Secondary | ICD-10-CM

## 2020-02-25 DIAGNOSIS — R053 Chronic cough: Secondary | ICD-10-CM

## 2020-02-25 DIAGNOSIS — R05 Cough: Secondary | ICD-10-CM

## 2020-02-25 MED ORDER — PREDNISONE 20 MG PO TABS
ORAL_TABLET | ORAL | 0 refills | Status: DC
Start: 2020-02-25 — End: 2020-04-24

## 2020-02-25 MED ORDER — BUDESONIDE-FORMOTEROL FUMARATE 80-4.5 MCG/ACT IN AERO
2.00 | INHALATION_SPRAY | Freq: Two times a day (BID) | RESPIRATORY_TRACT | 3 refills | Status: AC
Start: 2020-02-25 — End: ?

## 2020-02-25 MED ORDER — AMOXICILLIN-POT CLAVULANATE 875-125 MG PO TABS
1.00 | ORAL_TABLET | Freq: Two times a day (BID) | ORAL | 0 refills | Status: AC
Start: 2020-02-25 — End: 2020-03-10

## 2020-02-25 NOTE — Progress Notes (Signed)
[facility]  [facility] [place]    History of Presenting Illness:     Chief Complaint   Patient presents with    Sinusitis     runny nose, nasal congestiuon, non productive cough, gums hurt, sinus pressure, sore throat from coughing started Friday       Alicia Barry is a 64 y.o. female who presents to the office with complaints URI. Patient reports that 4 days ago she developed a headache and low grade fever. She then started having nasal congestion, facial tenderness, rhinorrhea, non-productive cough, loss of appetite a few days after. She is now experiencing worsening pain in her right upper back and fatigue. She denies high grade fevers, nausea, SOB, Chest pain, vomiting or diarrhea. She tried Tylenol that helped with the fever. She has history of bronchitis, sinusitis and allergies which she has tried the Flonase without much relief. She also have previous hx of pneumonia years ago, and was given both pneumococcal vaccines. She denies any recent travel, sick exposure to anyone with similar symptoms other then her grandson who has a viral infection. She denies using any antibiotics in the last 3 months.     Review of Systems:   Review of Systems   Constitutional: Positive for malaise/fatigue. Negative for chills, fever and weight loss.   HENT: Positive for congestion and sinus pain. Negative for ear discharge, ear pain and sore throat.    Eyes: Negative for pain, discharge and redness.   Respiratory: Positive for cough and wheezing. Negative for hemoptysis, sputum production and shortness of breath.    Cardiovascular: Negative.  Negative for chest pain and palpitations.   Gastrointestinal: Negative for abdominal pain, constipation, diarrhea, nausea and vomiting.   Musculoskeletal: Positive for back pain.   Skin: Negative.    Neurological: Positive for headaches.   Endo/Heme/Allergies: Positive for environmental allergies.     In addition to those noted in history  of present illness all other review of systems performed and were negative.    Past Medical History:     Past Medical History:   Diagnosis Date    Anxiety     Arthritis     Depression     Discharge from nipple     Disease of lung     Gastroesophageal reflux disease     Hyperlipidemia        Past Surgical History:     Past Surgical History:   Procedure Laterality Date    ANKLE SURGERY Right 02/02/2016    APPENDECTOMY      BREAST BIOPSY      breast biospy Right     COLONOSCOPY, POLYPECTOMY N/A 11/16/2016    Procedure: COLONOSCOPY, POLYPECTOMY;  Surgeon: Olena Leatherwood, MD;  [facility];  Service: Gastroenterology;  Laterality: N/A;    FRACTURE SURGERY Left     REPLACEMENT TOTAL KNEE Left     SHOULDER ARTHROSCOPY Right     TONSILLECTOMY Bilateral        Family History:     Family History   Problem Relation Age of Onset    Hypertension Mother     Stroke Mother     Osteoporosis Mother     Asthma Mother     Hypertension Father     Diabetes Father     Heart disease Father     Hypertension Sister     Hypertension Sister     Hypertension Sister     Heart attack Brother  Heart attack Paternal Grandmother        Social History:     Social History     Tobacco Use   Smoking Status Former Smoker    Quit date: 11/10/2001    Years since quitting: 18.3   Smokeless Tobacco Never Used     Social History     Substance and Sexual Activity   Alcohol Use No    Alcohol/week: 0.0 standard drinks     Social History     Substance and Sexual Activity   Drug Use No       Allergies:     Allergies   Allergen Reactions    Ibuprofen Anaphylaxis    Percocet [Oxycodone-Acetaminophen] Itching       Medications:     Prior to Admission medications    Medication Sig Start Date End Date Taking? Authorizing Provider   ALPRAZolam Prudy Feeler) 1 MG tablet Take 1 mg by mouth.Patient takes 1 tablet in the morning and 1 tablet in the evening.       Yes [provider]   Ascorbic Acid (VITAMIN C) 1000 MG  tablet Take 1,000 mg by mouth 2 (two) times daily.   Yes [provider]           Cholecalciferol (VITAMIN D3) 1000 UNIT/SPRAY Liquid Take 1 spray by mouth daily.   Yes [provider]   DULoxetine (CYMBALTA) 60 MG capsule Take 60 mg by mouth daily.   Yes [provider]   fluticasone (FLONASE) 50 MCG/ACT nasal spray 2 sprays in each nostril 1-2 times a day 07/07/18  Yes [name], FNP   folic acid (FOLVITE) 1 MG tablet TAKE 1 TABLET DAILY 11/05/19  Yes [name], FNP   losartan (Cozaar) 25 MG tablet Take 1 tablet (25 mg total) by mouth daily 01/24/20  Yes [name], FNP   montelukast (SINGULAIR) 10 MG tablet TAKE 1 TABLET NIGHTLY 01/18/20  Yes [name], FNP   pantoprazole (PROTONIX) 40 MG tablet TAKE 1 TABLET DAILY 11/05/19  Yes [name], FNP   simvastatin (ZOCOR) 20 MG tablet TAKE 1 TABLET NIGHTLY 11/23/19  Yes Philipp Deputy, NP   vitamin B-12 (CYANOCOBALAMIN) 1000 MCG tablet Take 1,000 mcg by mouth daily.   Yes [provider]   vitamin D, ergocalciferol, (DRISDOL) 50000 UNIT Cap TAKE 1 CAPSULE ONCE A WEEK 06/14/19  Yes [name], FNP   budesonide-formoterol [facility]) 80-4.5 MCG/ACT inhaler Inhale 2 puffs into the lungs 2 (two) times daily. 11/10/16 02/25/20 Yes [name], FNP   amoxicillin-clavulanate (Augmentin) [phone] MG per tablet Take 1 tablet by mouth 2 (two) times daily for 14 days 02/25/20 03/10/20  [name], FNP   predniSONE (DELTASONE) 20 MG tablet 3 tabs daily x 2, 2 tabs daily x 2, 1 tab daily x 2, 1/2 tab daily x 2 02/25/20   [name], FNP       Physical Exam:     Vitals:    02/25/20 1602   BP: 149/83   Pulse: 78   Resp: 16   Temp: 97.5 F (36.4 C)   SpO2: 97%     Body mass index is 31.35 kg/m.    Physical Exam  Constitutional:       General: She is not in acute distress.     Appearance: Normal appearance. She is normal weight. She is not ill-appearing or toxic-appearing.   HENT:      Head: Normocephalic and atraumatic.      Right Ear:  Tympanic  membrane, ear canal and external ear normal.      Left Ear: Tympanic membrane, ear canal and external ear normal.      Nose: Congestion and rhinorrhea present.      Right Turbinates: Swollen.      Left Turbinates: Swollen.      Right Sinus: Maxillary sinus tenderness and frontal sinus tenderness present.      Left Sinus: Maxillary sinus tenderness and frontal sinus tenderness present.      Mouth/Throat:      Mouth: Mucous membranes are moist.      Pharynx: No posterior oropharyngeal erythema.   Eyes:      Extraocular Movements: Extraocular movements intact.      Conjunctiva/sclera: Conjunctivae normal.      Pupils: Pupils are equal, round, and reactive to light.   Cardiovascular:      Rate and Rhythm: Normal rate and regular rhythm.      Pulses: Normal pulses.      Heart sounds: Normal heart sounds. No murmur heard.     Pulmonary:      Effort: Pulmonary effort is normal. No respiratory distress.      Breath sounds: Rhonchi and rales present.   Abdominal:      General: Abdomen is flat. Bowel sounds are normal.      Palpations: Abdomen is soft.   Musculoskeletal:         General: Normal range of motion.      Cervical back: Normal range of motion and neck supple.      Thoracic back: Tenderness (+CTA) present.   Skin:     General: Skin is warm and dry.      Capillary Refill: Capillary refill takes less than 2 seconds.   Neurological:      Mental Status: She is alert and oriented to person, place, and time.   Psychiatric:         Mood and Affect: Mood normal.         Behavior: Behavior normal.         Thought Content: Thought content normal.         Judgment: Judgment normal.         Assessment:       ICD-10-CM    1. Acute non-recurrent maxillary sinusitis  J01.00 amoxicillin-clavulanate (Augmentin) 875-125 MG per tablet     predniSONE (DELTASONE) 20 MG tablet   2. Pneumonia due to infectious organism, unspecified laterality, unspecified part of lung  J18.9    3. Chronic cough  R05 budesonide-formoterol (Symbicort)  80-4.5 MCG/ACT inhaler        Plan:   Acute non-recurrent maxillary sinusitis  Pneumonia due to infectious organism, unspecified laterality, unspecified part of lung  Chronic cough  Acute symptoms worsening, physical exam consistent with sinusitis/bronchitis and suspecting lower lobe pneumonia (given +CTA BIL/right sided flank pain) along with respiratory symptoms and abnormal breath sounds. We will treat with Augmentin x14 days, if symptoms persist will obtain CXR. Steroids given to help reduce inflammation, has tolerated well in the past. Use Inhaler prn. Recommended to rest and hydrate.   Precautions reviewed to call for f/up if: Fever of 100.64F ( 38C) or higher, large amount of colored sputum, vomiting, or if symptoms getting worse.   Plan:   - amoxicillin-clavulanate (Augmentin) 875-125 MG per tablet; Take 1 tablet by mouth 2 (two) times daily for 14 days  Dispense: 28 tablet; Refill: 0  - predniSONE (DELTASONE) 20 MG tablet; 3 tabs daily x 2,  2 tabs daily x 2, 1 tab daily x 2, 1/2 tab daily x 2  Dispense: 13 tablet; Refill: 0  - budesonide-formoterol (Symbicort) 80-4.5 MCG/ACT inhaler; Inhale 2 puffs into the lungs 2 (two) times daily  Dispense: 3 Inhaler; Refill: 3    --  I, Tora Kindred, FNP Student, participated in this encounter, assisting with obtaining history, performing physical assessment from the patient and developing treatment plan along with the preceptor.     Melida Gimenez, FNP, have reviewed the information for accuracy and agree with the content.   Signed by [name], FNP

## 2020-04-24 ENCOUNTER — Encounter (RURAL_HEALTH_CENTER): Payer: Self-pay | Admitting: Family

## 2020-04-24 ENCOUNTER — Other Ambulatory Visit
Admission: RE | Admit: 2020-04-24 | Discharge: 2020-04-24 | Disposition: A | Discharge status: Home / Self Care | Payer: Commercial Managed Care - POS | Source: Ambulatory Visit | Attending: Family | Admitting: Family

## 2020-04-24 ENCOUNTER — Ambulatory Visit: Payer: Commercial Managed Care - POS | Attending: Family | Admitting: Family

## 2020-04-24 VITALS — BP 120/78 | HR 70 | Temp 98.0°F | Resp 19 | Ht 65.98 in | Wt 196.4 lb

## 2020-04-24 DIAGNOSIS — E559 Vitamin D deficiency, unspecified: Secondary | ICD-10-CM

## 2020-04-24 DIAGNOSIS — R05 Cough: Secondary | ICD-10-CM

## 2020-04-24 DIAGNOSIS — E538 Deficiency of other specified B group vitamins: Secondary | ICD-10-CM

## 2020-04-24 DIAGNOSIS — F339 Major depressive disorder, recurrent, unspecified: Secondary | ICD-10-CM

## 2020-04-24 DIAGNOSIS — Z1239 Encounter for other screening for malignant neoplasm of breast: Secondary | ICD-10-CM

## 2020-04-24 DIAGNOSIS — E782 Mixed hyperlipidemia: Secondary | ICD-10-CM

## 2020-04-24 DIAGNOSIS — Z0000 Encounter for general adult medical examination without abnormal findings: Secondary | ICD-10-CM

## 2020-04-24 DIAGNOSIS — R053 Chronic cough: Secondary | ICD-10-CM

## 2020-04-24 LAB — LIPID PANEL
Cholesterol: 206 mg/dL — ABNORMAL HIGH (ref 75–199)
Coronary Heart Disease Risk: 4.2
HDL: 49 mg/dL (ref 45–65)
LDL Calculated: 136 mg/dL
Triglycerides: 104 mg/dL (ref 10–150)
VLDL: 21 (ref 0–40)

## 2020-04-24 LAB — COMPREHENSIVE METABOLIC PANEL
ALT: 12 U/L (ref 0–55)
AST (SGOT): 14 U/L (ref 10–42)
Albumin/Globulin Ratio: 1.09 Ratio (ref 0.80–2.00)
Albumin: 3.6 gm/dL (ref 3.5–5.0)
Alkaline Phosphatase: 95 U/L (ref 40–145)
Anion Gap: 14.4 mMol/L (ref 7.0–18.0)
BUN / Creatinine Ratio: 11.9 Ratio (ref 10.0–30.0)
BUN: 10 mg/dL (ref 7–22)
Bilirubin, Total: 0.4 mg/dL (ref 0.1–1.2)
CO2: 30 mMol/L (ref 20–30)
Calcium: 9.1 mg/dL (ref 8.5–10.5)
Chloride: 103 mMol/L (ref 98–110)
Creatinine: 0.84 mg/dL (ref 0.60–1.20)
EGFR: 74 mL/min/{1.73_m2} (ref 60–150)
Globulin: 3.3 gm/dL (ref 2.0–4.0)
Glucose: 96 mg/dL (ref 71–99)
Osmolality Calculated: 284 mOsm/kg (ref 275–300)
Potassium: 4.4 mMol/L (ref 3.5–5.3)
Protein, Total: 6.9 gm/dL (ref 6.0–8.3)
Sodium: 143 mMol/L (ref 136–147)

## 2020-04-24 LAB — CBC AND DIFFERENTIAL
Basophils %: 1.3 % (ref 0.0–3.0)
Basophils Absolute: 0.1 10*3/uL (ref 0.0–0.3)
Eosinophils %: 1.8 % (ref 0.0–7.0)
Eosinophils Absolute: 0.2 10*3/uL (ref 0.0–0.8)
Hematocrit: 50.2 % — ABNORMAL HIGH (ref 36.0–48.0)
Hemoglobin: 14.9 gm/dL (ref 12.0–16.0)
Lymphocytes Absolute: 2.9 10*3/uL (ref 0.6–5.1)
Lymphocytes: 29.9 % (ref 15.0–46.0)
MCH: 31 pg (ref 28–35)
MCHC: 30 gm/dL — ABNORMAL LOW (ref 32–36)
MCV: 103 fL — ABNORMAL HIGH (ref 80–100)
MPV: 8.8 fL (ref 6.0–10.0)
Monocytes Absolute: 0.5 10*3/uL (ref 0.1–1.7)
Monocytes: 5 % (ref 3.0–15.0)
Neutrophils %: 62.1 % (ref 42.0–78.0)
Neutrophils Absolute: 6.1 10*3/uL (ref 1.7–8.6)
PLT CT: 259 10*3/uL (ref 130–440)
RBC: 4.9 10*6/uL (ref 3.80–5.00)
RDW: 13.6 % (ref 11.0–14.0)
WBC: 9.8 10*3/uL (ref 4.0–11.0)

## 2020-04-24 LAB — VITAMIN B12 AND FOLATE
Folate: 19.6 ng/mL (ref 7.0–19.9)
Vitamin B-12: 185 pg/mL — ABNORMAL LOW (ref 213–816)

## 2020-04-24 LAB — TSH: TSH: 3.67 u[IU]/mL (ref 0.40–4.20)

## 2020-04-24 NOTE — Progress Notes (Signed)
[facility]  [facility] [place]    History of Presenting Illness:     Chief Complaint   Patient presents with    6 Month Follow Up       Alicia Barry is a 64 y.o. female who presents to the office for well adult exam.  She had a normal Pap smear in November 2019.  This will be repeated next year.  She had a normal colonoscopy and mammogram in 2018.    Elgene states that she is feeling very well.  Her depression is well controlled on current medication.  GERD issues are well controlled.  She has occasional cough but feels that this is benign and is not problematic.  She denies any shortness of breath.    Review of Systems:   ROS  In addition to those noted in history of present illness all other review of systems performed and were negative.    Past Medical History:     Past Medical History:   Diagnosis Date    Anxiety     Arthritis     Depression     Discharge from nipple     Disease of lung     Gastroesophageal reflux disease     Hyperlipidemia        Past Surgical History:     Past Surgical History:   Procedure Laterality Date    ANKLE SURGERY Right 02/02/2016    APPENDECTOMY      BREAST BIOPSY      breast biospy Right     COLONOSCOPY, POLYPECTOMY N/A 11/16/2016    Procedure: COLONOSCOPY, POLYPECTOMY;  Surgeon: Olena Leatherwood, MD;  [facility];  Service: Gastroenterology;  Laterality: N/A;    FRACTURE SURGERY Left     REPLACEMENT TOTAL KNEE Left     SHOULDER ARTHROSCOPY Right     TONSILLECTOMY Bilateral        Family History:     Family History   Problem Relation Age of Onset    Hypertension Mother     Stroke Mother     Osteoporosis Mother     Asthma Mother     Hypertension Father     Diabetes Father     Heart disease Father     Hypertension Sister     Hypertension Sister     Hypertension Sister     Heart attack Brother     Heart attack Paternal Grandmother        Social History:     Social History     Tobacco Use   Smoking  Status Former Smoker    Quit date: 11/10/2001    Years since quitting: 18.4   Smokeless Tobacco Never Used     Social History     Substance and Sexual Activity   Alcohol Use No    Alcohol/week: 0.0 standard drinks     Social History     Substance and Sexual Activity   Drug Use No       Allergies:     Allergies   Allergen Reactions    Ibuprofen Anaphylaxis    Percocet [Oxycodone-Acetaminophen] Itching       Medications:     Prior to Admission medications    Medication Sig Start Date End Date Taking? Authorizing Provider   ALPRAZolam Prudy Feeler) 1 MG tablet Take 1 mg by mouth Patient takes 1 tablet in the morning and 1 tablet in the evening. Psychiatrist wanted patient to start cutting  back so she is down to 55 pills a month.     Yes [provider]   Ascorbic Acid (VITAMIN C) 1000 MG tablet Take 1,000 mg by mouth 2 (two) times daily.   Yes [provider]   budesonide-formoterol (Symbicort) 80-4.5 MCG/ACT inhaler Inhale 2 puffs into the lungs 2 (two) times daily 02/25/20  Yes [name], FNP   Cholecalciferol (VITAMIN D3) 1000 UNIT/SPRAY Liquid Take 1 spray by mouth daily.   Yes [provider]   DULoxetine (CYMBALTA) 60 MG capsule Take 60 mg by mouth daily.   Yes [provider]   fluticasone (FLONASE) 50 MCG/ACT nasal spray 2 sprays in each nostril 1-2 times a day 07/07/18  Yes [name], FNP   folic acid (FOLVITE) 1 MG tablet TAKE 1 TABLET DAILY 11/05/19  Yes [name], FNP   losartan (Cozaar) 25 MG tablet Take 1 tablet (25 mg total) by mouth daily 01/24/20  Yes [name], FNP   montelukast (SINGULAIR) 10 MG tablet TAKE 1 TABLET NIGHTLY 01/18/20  Yes [name], FNP   pantoprazole (PROTONIX) 40 MG tablet TAKE 1 TABLET DAILY 11/05/19  Yes [name], FNP   simvastatin (ZOCOR) 20 MG tablet TAKE 1 TABLET NIGHTLY 11/23/19  Yes Philipp Deputy, NP   vitamin B-12 (CYANOCOBALAMIN) 1000 MCG tablet Take 1,000 mcg by mouth daily.   Yes [provider]   vitamin D, ergocalciferol,  (DRISDOL) 50000 UNIT Cap TAKE 1 CAPSULE ONCE A WEEK 06/14/19  Yes [name], FNP               Physical Exam:     Vitals:    04/24/20 0830   BP: 101/47   Pulse: 70   Resp: 19   Temp: 98 F (36.7 C)   SpO2: 96%     Body mass index is 31.72 kg/m.    Physical Exam  Constitutional:  Well developed, well nourished, no acute distress, non-toxic appearance.  Weight is up 2 pounds.  Eyes: EOM normal   HENT:  Atraumatic. Neck- normal range of motion, no tenderness, supple   Respiratory:  No respiratory distress, normal breath sounds, no rales, no wheezing   Cardiovascular:  Normal rate, normal rhythm, no murmurs, no gallops, no rubs   Musculoskeletal:  No EOM  Integument:  Well hydrated, no rash   Lymphatic:  No lymphadenopathy noted   Neurologic:  Alert & oriented x 3, normal motor function, normal sensory function, no focal deficits noted   Psychiatric:  Speech and behavior appropriate  Assessment:     1. Well adult exam     2. Mixed hyperlipidemia  CBC and differential    Comprehensive metabolic panel    Lipid panel    TSH   3. Vitamin D deficiency  Vitamin D,25 OH, Total   4. Vitamin B12 deficiency  Vitamin B12 And Folate   5. Folic acid deficiency  Vitamin B12 And Folate   6. Recurrent major depressive disorder, remission status unspecified     7. Chronic cough     8. Breast screening  Mammo Digital Screening Bilateral W Cad    US Breast Bilateral LTD       Plan:   Fasting blood work today will include CBC, CMP, lipid panel, TSH, vitamin D 25-hydroxy and vitamin B12/folate.    Continue current medications and supplements, no refills needed today.    We will defer Pap smear until next year which will be 3 years since her last.  She is due for a mammogram which has been ordered today.  An ultrasound was done with her last mammogram in 2018 so that has been ordered also for use if indicated by mammogram.    Return for a routine checkup in 6 months, sooner if needed.    Signed by [name] FNP    This note was  completed using Dragon Medical speech recognition software. Grammatical errors, random word insertions, pronoun errors and incomplete sentences are occasional consequences of this technology due to software limitations. If there are questions or concerns about the content of this note or information contained within the body of this dictation they should be addressed with the provider for clarification.

## 2020-04-25 ENCOUNTER — Other Ambulatory Visit (RURAL_HEALTH_CENTER): Payer: Self-pay | Admitting: Family

## 2020-04-25 DIAGNOSIS — E559 Vitamin D deficiency, unspecified: Secondary | ICD-10-CM

## 2020-04-25 LAB — VITAMIN D,25 OH,TOTAL: Vitamin D 25-Hydroxy: 37 ng/mL (ref 30.0–80.0)

## 2020-04-29 ENCOUNTER — Ambulatory Visit
Admission: RE | Admit: 2020-04-29 | Discharge: 2020-04-29 | Disposition: A | Discharge status: Home / Self Care | Payer: Commercial Managed Care - POS | Source: Ambulatory Visit | Attending: Family | Admitting: Family

## 2020-04-29 DIAGNOSIS — Z1239 Encounter for other screening for malignant neoplasm of breast: Secondary | ICD-10-CM | POA: Insufficient documentation

## 2020-04-30 ENCOUNTER — Other Ambulatory Visit (RURAL_HEALTH_CENTER): Payer: Self-pay | Admitting: Family

## 2020-04-30 DIAGNOSIS — E538 Deficiency of other specified B group vitamins: Secondary | ICD-10-CM

## 2020-04-30 MED ORDER — CYANOCOBALAMIN 1000 MCG/ML IJ SOLN
1000.0000 ug | INTRAMUSCULAR | Status: AC
Start: 2020-04-30 — End: ?
  Administered 2020-05-01 – 2020-12-12 (×8): 1000 ug via INTRAMUSCULAR

## 2020-05-01 ENCOUNTER — Ambulatory Visit: Payer: Commercial Managed Care - POS

## 2020-05-01 DIAGNOSIS — E538 Deficiency of other specified B group vitamins: Secondary | ICD-10-CM

## 2020-05-01 DIAGNOSIS — M797 Fibromyalgia: Secondary | ICD-10-CM

## 2020-05-01 NOTE — Progress Notes (Signed)
Patient given cyanocobalamin in left arm with no issues - kl ma

## 2020-05-15 ENCOUNTER — Ambulatory Visit: Payer: Commercial Managed Care - POS

## 2020-05-15 DIAGNOSIS — E538 Deficiency of other specified B group vitamins: Secondary | ICD-10-CM

## 2020-05-16 ENCOUNTER — Ambulatory Visit (RURAL_HEALTH_CENTER): Payer: Commercial Managed Care - POS

## 2020-06-13 ENCOUNTER — Ambulatory Visit: Payer: Commercial Managed Care - POS

## 2020-06-13 DIAGNOSIS — E538 Deficiency of other specified B group vitamins: Secondary | ICD-10-CM

## 2020-06-13 NOTE — Progress Notes (Signed)
Patient came into the office for her B12 injection - patient tolerated this well.   Given in the right deltoid.

## 2020-07-17 ENCOUNTER — Ambulatory Visit: Payer: Commercial Managed Care - POS

## 2020-07-17 DIAGNOSIS — M797 Fibromyalgia: Secondary | ICD-10-CM

## 2020-07-17 DIAGNOSIS — E538 Deficiency of other specified B group vitamins: Secondary | ICD-10-CM

## 2020-08-15 ENCOUNTER — Ambulatory Visit: Payer: Commercial Managed Care - POS

## 2020-08-15 DIAGNOSIS — E538 Deficiency of other specified B group vitamins: Secondary | ICD-10-CM

## 2020-09-15 ENCOUNTER — Ambulatory Visit (RURAL_HEALTH_CENTER): Payer: Self-pay

## 2020-09-22 ENCOUNTER — Ambulatory Visit: Payer: Commercial Managed Care - POS

## 2020-09-22 DIAGNOSIS — E538 Deficiency of other specified B group vitamins: Secondary | ICD-10-CM

## 2020-10-23 ENCOUNTER — Ambulatory Visit (RURAL_HEALTH_CENTER): Payer: Self-pay

## 2020-10-27 ENCOUNTER — Ambulatory Visit (RURAL_HEALTH_CENTER): Payer: Self-pay

## 2020-10-27 ENCOUNTER — Other Ambulatory Visit (RURAL_HEALTH_CENTER): Payer: Self-pay | Admitting: Family

## 2020-10-30 ENCOUNTER — Other Ambulatory Visit (RURAL_HEALTH_CENTER): Payer: Self-pay | Admitting: Family

## 2020-10-30 ENCOUNTER — Other Ambulatory Visit (RURAL_HEALTH_CENTER): Payer: Self-pay

## 2020-10-30 MED ORDER — PANTOPRAZOLE SODIUM 40 MG PO TBEC
40.0000 mg | DELAYED_RELEASE_TABLET | Freq: Every day | ORAL | 3 refills | Status: AC
Start: 2020-10-30 — End: ?

## 2020-10-30 MED ORDER — PANTOPRAZOLE SODIUM 40 MG PO TBEC
40.0000 mg | DELAYED_RELEASE_TABLET | Freq: Every day | ORAL | 3 refills | Status: DC
Start: 2020-10-30 — End: 2020-10-30

## 2020-10-30 NOTE — Telephone Encounter (Signed)
Patient called regarding RX sent to [facility] - kl,ma

## 2020-10-31 ENCOUNTER — Ambulatory Visit: Payer: Commercial Managed Care - POS

## 2020-10-31 DIAGNOSIS — E538 Deficiency of other specified B group vitamins: Secondary | ICD-10-CM

## 2020-10-31 DIAGNOSIS — M797 Fibromyalgia: Secondary | ICD-10-CM

## 2020-11-15 ENCOUNTER — Emergency Department: Payer: Commercial Managed Care - POS

## 2020-11-15 ENCOUNTER — Emergency Department
Admission: EM | Admit: 2020-11-15 | Discharge: 2020-11-15 | Disposition: A | Discharge status: Home / Self Care | Payer: Commercial Managed Care - POS | Attending: Emergency Medicine | Admitting: Emergency Medicine

## 2020-11-15 DIAGNOSIS — S20211A Contusion of right front wall of thorax, initial encounter: Secondary | ICD-10-CM | POA: Insufficient documentation

## 2020-11-15 DIAGNOSIS — W010XXA Fall on same level from slipping, tripping and stumbling without subsequent striking against object, initial encounter: Secondary | ICD-10-CM | POA: Insufficient documentation

## 2020-11-15 MED ORDER — HYDROCODONE-ACETAMINOPHEN 5-325 MG PO TABS
1.0000 | ORAL_TABLET | Freq: Four times a day (QID) | ORAL | 0 refills | Status: DC | PRN
Start: 2020-11-15 — End: 2020-11-21

## 2020-11-15 MED ORDER — HYDROCODONE-ACETAMINOPHEN 5-325 MG PO TABS
1.0000 | ORAL_TABLET | ORAL | Status: DC | PRN
Start: 2020-11-15 — End: 2020-11-15
  Administered 2020-11-15: 19:00:00 1

## 2020-11-15 MED ORDER — HYDROCODONE-ACETAMINOPHEN 5-325 MG PO TABS
ORAL_TABLET | ORAL | Status: AC
Start: 2020-11-15 — End: ?
  Filled 2020-11-15: qty 3

## 2020-11-15 MED ORDER — NALOXONE HCL 4 MG/0.1ML NA LIQD
NASAL | 0 refills | Status: AC
Start: 2020-11-15 — End: ?

## 2020-11-15 NOTE — ED Notes (Signed)
Norco given to go, 3 tabs.

## 2020-11-15 NOTE — ED Notes (Signed)
RX plus discharge instructions,verbalizes understanding.

## 2020-11-15 NOTE — ED Provider Notes (Signed)
[facility] EMERGENCY DEPARTMENT  [place], [place]       Voice recognition software was used to generate this report. Every attempt was made at correction, however, word substitution, grammatical, and transcription errors may occur.     Event Date/Time    Physician/Midlevel provider first contact with patient: 11/15/20 1854         FINAL DIAGNOSIS:  1. Contusion of right chest wall, initial encounter           ASSESSMENT/DIFFERENTIAL DIAGNOSIS:  Alicia Barry is a 65 y.o. female with a right chest contusion after a fall, uncomplicated. No definitive evidence of ischemic, embolic, surgical, bacterial disease.      The differential diagnoses that were considered in the evaluation of this patient include, but were not limited to, fracture, contusion, pneumothorax, hemothorax, pulmonary contusion, among others. -     ED COURSE & MEDICAL DECISION MAKING:    The patient had a reassuring work-up.  I gave her verbal instructions about watching out for fevers, shortness of breath, worsening pain.  Hydrocodone-acetaminophen to go, stating that she has taken that before without difficulty. -      Visit Vitals  BP 117/80   Pulse 75   Temp 99.7 F (37.6 C) (Tympanic)   Resp 18   Ht 1.676 m   Wt 89.4 kg   SpO2 98%   BMI 31.80 kg/m         RISK ASSESSMENT: The patient's records were checked through the [employer] (and other states where applicable and available), used in accordance with official notices posted in the Emergency Department and waiting room in accordance with law. This is to screen for misuse, overuse, doctor-shopping, and/or diversion. This is a patient safety issue. After review, I do not see dangerous concerns.  She is on chronic alprazolam      Patient's evaluation indicates the patient does not appear to have a need for inpatient workup and/or management. No further testing indicated acutely. Prior visits/ charts: reviewed. Nurses' notes reviewed. Pertinent lab and  radiology data was reviewed by me and discussed with patient and family, if present. I had a detailed discussion about the diagnosis, workup and treatment plan, and we are in agreement.    MEDICATIONS ORDERED THIS VISIT:     Medications   HYDROcodone-acetaminophen (NORCO) tablet 5-325 mg (Take Home Medication) (has no administration in time range)       PROCEDURES:   Procedures    CONSULTATION:   None     DISPOSITION:    Discharge from the Emergency Department.      New Prescriptions    HYDROCODONE-ACETAMINOPHEN (NORCO) 5-325 MG PER TABLET    Take 1 tablet by mouth every 6 (six) hours as needed for Pain    NALOXONE (NARCAN) 4 MG/0.1ML NASAL SPRAY    1 spray intranasally. If pt does not respond or relapses into respiratory depression call 911. Give additional doses every 2-3 min.       CONDITION AT DISPOSITION: Good, unchanged. Stable.         HISTORIAN:  History obtained from the patient.    CHIEF COMPLAINT:  Chief Complaint   Patient presents with    Rib Injury     Larey Seat on right chest    Fall    " Hit my chest" according to patient    HISTORY OF PRESENT ILLNESS:    Alicia Barry is a very nice 65 y.o. female who complains of a mechanical ground-level  trip and fall around 1430 today.  She states she was coming out of a tent, tripped over the sill, fell forward and struck her chest on a hose storage box.  Pain in the right anterior chest in the pectoral region with tenderness.  Hurts to take a deep breath or move about.  Not overtly short of breath but because it hurts to take deep breaths she feels a little bit that way.  No loss of consciousness.  No open wounds.  No injury to the head, neck, back, abdomen.  Not on any blood thinners.   HPI -   .  REVIEW OF SYSTEMS:   Review of Systems. At least 6 systems reviewed and negative, unless otherwise specified above.    PAST MEDICAL/SURGICAL HISTORY:  Past Medical History:   Diagnosis Date    Anxiety     Arthritis     Depression     Discharge from nipple      Disease of lung     Gastroesophageal reflux disease     Hyperlipidemia    .   Past Surgical History:   Procedure Laterality Date    ANKLE SURGERY Right 02/02/2016    APPENDECTOMY (OPEN)      BREAST BIOPSY      breast biospy Right     COLONOSCOPY, POLYPECTOMY N/A 11/16/2016    Procedure: COLONOSCOPY, POLYPECTOMY;  Surgeon: Olena Leatherwood, MD;  Location[facility];  Service: Gastroenterology;  Laterality: N/A;    FRACTURE SURGERY Left     REPLACEMENT TOTAL KNEE Left     SHOULDER ARTHROSCOPY Right     TONSILLECTOMY Bilateral    .     IMMUNIZATIONS:  Immunization History   Administered Date(s) Administered    COVID-19 mRNA vaccine 12 years and above [employer] Cap) 30 mcg/0.3 mL 11/29/2019, 12/20/2019    Influenza Vacc Quad MDCK cell cult 4 yrs & up 08/07/2015    Influenza quad 6 MOS to 64 YRS (Flulaval/Fluarix) 06/15/2017, 07/07/2018, 10/25/2019    Pneumococcal Conjugate 13-Valent 09/13/2018, 10/25/2019      COVID19 Vaccine Status:   []  Unknown    []  Documented or known infection: none  []  None Partial Fully Booster   [x]  Pfizer/BioNtech []  [x]  []    []  Moderna []  []  []    []  Johnson&Johnson  []  []    []  Other []  []  []    []  Unknown which []  []  []        FAMILY HISTORY:  Family History   Problem Relation Age of Onset    Hypertension Mother     Stroke Mother     Osteoporosis Mother     Asthma Mother     Hypertension Father     Diabetes Father     Heart disease Father     Hypertension Sister     Hypertension Sister     Hypertension Sister     Heart attack Brother     Heart attack Paternal Grandmother        HOME MEDICATIONS:  Home Medications     Med List Status: In Progress Set By: Lorane Gell, RN at 11/15/2020  6:39 PM                ALPRAZolam (XANAX) 1 MG tablet     Take 1 mg by mouth Patient takes 1 tablet in the morning and 1 tablet in the evening. Psychiatrist wanted patient to start cutting back so she is down to 55 pills a month.  Ascorbic Acid (VITAMIN C) 1000 MG tablet      Take 1,000 mg by mouth 2 (two) times daily.     budesonide-formoterol (Symbicort) 80-4.5 MCG/ACT inhaler     Inhale 2 puffs into the lungs 2 (two) times daily     Cholecalciferol (VITAMIN D3) 1000 UNIT/SPRAY Liquid     Take 1 spray by mouth daily.     DULoxetine (CYMBALTA) 60 MG capsule     Take 60 mg by mouth daily.     fluticasone (FLONASE) 50 MCG/ACT nasal spray     2 sprays in each nostril 1-2 times a day     folic acid (FOLVITE) 1 MG tablet     TAKE 1 TABLET DAILY     losartan (Cozaar) 25 MG tablet     Take 1 tablet (25 mg total) by mouth daily     montelukast (SINGULAIR) 10 MG tablet     TAKE 1 TABLET NIGHTLY     pantoprazole (PROTONIX) 40 MG tablet     Take 1 tablet (40 mg total) by mouth daily     simvastatin (ZOCOR) 20 MG tablet     TAKE 1 TABLET NIGHTLY     vitamin B-12 (CYANOCOBALAMIN) 1000 MCG tablet     Take 1,000 mcg by mouth daily.     vitamin B-12 (CYANOCOBALAMIN) injection 1,000 mcg     1,000 mcg, Intramuscular, Every 14 days, First dose on Wed 04/30/20 at 1900     vitamin D, ergocalciferol, (DRISDOL) 50000 UNIT Cap     TAKE 1 CAPSULE ONCE A WEEK           ALLERGY:  Allergies   Allergen Reactions    Ibuprofen Anaphylaxis    Percocet [Oxycodone-Acetaminophen] Itching       SOCIAL HISTORY:   Social History     Tobacco Use    Smoking status: Former Smoker     Quit date: 11/10/2001     Years since quitting: 19.0    Smokeless tobacco: Never Used   Vaping Use    Vaping Use: Never used   Substance Use Topics    Alcohol use: No     Alcohol/week: 0.0 standard drinks    Drug use: No       NOTE: If the PMH or PSH or social history sections read "Not on file" or are left blank it reflects that the histories were verbally investigated but there is no past medical or surgical history, or tobacco, alcohol or recreational drug use except as noted.    PHYSICAL EXAMINATION:  VITAL SIGNS reviewed by me:   Vitals:    11/15/20 1832   BP: 117/80   Pulse: 75   Resp: 18   Temp: 99.7 F (37.6 C)   SpO2: 98%       Reviewed by the Emergency Physician  Pulse Ox Interpretation by Emergency Physician: normal   WEIGHT:   Weight Monitoring 02/25/2020 04/24/2020 11/15/2020   Height 167.6 cm 167.6 cm 167.6 cm   Height Method - - Stated   Weight 88.043 kg 89.086 kg 89.359 kg   Weight Method - - Stated   BMI (calculated) 31.4 kg/m2 31.8 kg/m2 31.9 kg/m2       CONSTITUTIONAL: Vital signs reviewed. Patient is well-appearing. Patient appears uncomfortable. Hydration: normal. Not toxic appearing.   HEAD: Atraumatic. Normocephalic.   EYES: Eyes are normal to inspection. Pupils equal, round, reactive to light. Discharge from eyes: none. Extraocular muscles: intact.   ENT: No facial swelling. External ears  normal. No discharge from ears.  No facial injury  NECK: Range of motion: normal. Tenderness: none. Trachea midline. Jugular venous distention: none.   CARDIOVASCULAR: Rate: normal. Rhythm: Regular. Murmurs: none. Normal S1 S2.   RESPIRATORY/CHEST: Chest tenderness: Right pectoral.  There may be some mild ecchymosis in the superior aspect of the right breast without otherwise any visible or palpable abnormality. Breath sounds: normal. Respiratory distress: none. Accessory muscle use: none. Work of breathing: normal.  Pain on deep inspiration.  BACK: Tenderness: none. Range of motion: normal for age. Visible abnormality: none. Palpable abnormality: none.  ABDOMEN: Soft. Distention: none. Tenderness: none. No guarding. No rebound. Bowel sounds: normal.   RECTAL: Not performed.   GENITALIA: Not examined.   NEUROLOGIC:  Awake. Alert. Strength: normal. Sensation: normal. Cranial nerves: intact. Conversation: fluent.  SKIN: Skin is normal color, warm, and dry. Skin intact. Rash: None.   LYMPHATIC: No pathologic adenopathy. No lymphangitis.   PSYCHIATRIC: Affect: anxious. Insight: normal.  Physical Exam    DATA REVIEWED:   LABS:      Results     None          Reviewed and interpreted by the Emergency Physician     RADIOLOGY:   XR Chest AP  Portable    Result Date: 11/15/2020  Negative chest. ReadingStation:[id number]      Reviewed and interpreted by the Emergency Physician       Vida Rigger, MD  11/15/20 1924

## 2020-11-15 NOTE — ED Triage Notes (Signed)
Patient fell onto a hose box today and injured her right chest area, hurts to breathe.

## 2020-11-17 ENCOUNTER — Encounter (RURAL_HEALTH_CENTER): Payer: Self-pay | Admitting: Family

## 2020-11-18 ENCOUNTER — Encounter (RURAL_HEALTH_CENTER): Payer: Self-pay | Admitting: Family

## 2020-11-20 ENCOUNTER — Encounter (RURAL_HEALTH_CENTER): Payer: Self-pay | Admitting: Family

## 2020-11-21 ENCOUNTER — Ambulatory Visit: Payer: Commercial Managed Care - POS | Attending: Family | Admitting: Family

## 2020-11-21 ENCOUNTER — Encounter (RURAL_HEALTH_CENTER): Payer: Self-pay

## 2020-11-21 ENCOUNTER — Telehealth (RURAL_HEALTH_CENTER): Payer: Self-pay

## 2020-11-21 ENCOUNTER — Encounter (RURAL_HEALTH_CENTER): Payer: Self-pay | Admitting: Family

## 2020-11-21 VITALS — BP 117/66 | HR 73 | Temp 97.3°F | Resp 16 | Ht 66.0 in | Wt 195.6 lb

## 2020-11-21 DIAGNOSIS — E538 Deficiency of other specified B group vitamins: Secondary | ICD-10-CM

## 2020-11-21 DIAGNOSIS — I10 Essential (primary) hypertension: Secondary | ICD-10-CM

## 2020-11-21 DIAGNOSIS — E782 Mixed hyperlipidemia: Secondary | ICD-10-CM

## 2020-11-21 DIAGNOSIS — R0789 Other chest pain: Secondary | ICD-10-CM

## 2020-11-21 DIAGNOSIS — E559 Vitamin D deficiency, unspecified: Secondary | ICD-10-CM

## 2020-11-21 MED ORDER — TIZANIDINE HCL 4 MG PO TABS
4.0000 mg | ORAL_TABLET | Freq: Four times a day (QID) | ORAL | 0 refills | Status: AC | PRN
Start: 2020-11-21 — End: ?

## 2020-11-21 MED ORDER — LIDOCAINE 5 % EX PTCH
2.0000 | MEDICATED_PATCH | CUTANEOUS | 0 refills | Status: AC
Start: 2020-11-21 — End: ?

## 2020-11-21 NOTE — Telephone Encounter (Addendum)
Received prior auth request from [facility] for lidocaine 5% patches - sent to covermymeds and it could not be processed asked for assistance and they faxed me a hard copy  To complete and then faxed to [employer] - Response Patient not covered through Prescriptions - kl,ma

## 2020-11-21 NOTE — Telephone Encounter (Addendum)
Response from [employer] - Patient is not covered for Prrescription under the health insurance plan - Sent mychart message to patient asking if they had a different prescription plan and to send [place] the information - Then called [facility] and asked what information they had for patient and I was told [employer]  #[id number] for prescription coverage - submitted to covermymeds and it would not accept the information - I requested assistance and they again faxed a hard copy for me to complete and fax to [facility] along with SOAP- kl,ma

## 2020-11-21 NOTE — Progress Notes (Signed)
[facility]  [facility] [place]    History of Presenting Illness:     Chief Complaint   Patient presents with    Pain     Larey Seat on Saturday and was seen in ER - and was seen at [facility] - dx with bruised chest wall - patient states she is still in pain and requests muscle relaxers - kl,ma    She is currently out of the pain meds she was prescribed at the ER - kl,ma       Alicia Barry is a 65 y.o. female who presents to the office with complaints of chest wall pain originating 6 days ago when she tripped coming out of a tent and hit the corner of a hose box with her chest.  She was seen in the emergency room where x-rays showed no rib fracture.  She was provided a small amount of Norco for pain relief which has been helpful, especially at night, but she does not want to continue to take narcotics.  She has an allergy to ibuprofen.  She reports that the pain is significant.  She denies any impaired range of motion but feels increased pain with movement.    Alicia Barry was last seen in the office for a checkup 7 months ago.  She reports that her medications are up-to-date and she is otherwise feeling well.    Review of Systems:   ROS  In addition to those noted in history of present illness all other review of systems performed and were negative.    Past Medical History:     Past Medical History:   Diagnosis Date    Anxiety     Arthritis     Depression     Discharge from nipple     Disease of lung     Gastroesophageal reflux disease     Hyperlipidemia        Past Surgical History:     Past Surgical History:   Procedure Laterality Date    ANKLE SURGERY Right 02/02/2016    APPENDECTOMY (OPEN)      BREAST BIOPSY      breast biospy Right     COLONOSCOPY, POLYPECTOMY N/A 11/16/2016    Procedure: COLONOSCOPY, POLYPECTOMY;  Surgeon: Olena Leatherwood, MD;  Location: [facility];  Service: Gastroenterology;  Laterality: N/A;    FRACTURE SURGERY Left      REPLACEMENT TOTAL KNEE Left     SHOULDER ARTHROSCOPY Right     TONSILLECTOMY Bilateral        Family History:     Family History   Problem Relation Age of Onset    Hypertension Mother     Stroke Mother     Osteoporosis Mother     Asthma Mother     Hypertension Father     Diabetes Father     Heart disease Father     Hypertension Sister     Hypertension Sister     Hypertension Sister     Heart attack Brother     Heart attack Paternal Grandmother        Social History:     Social History     Tobacco Use   Smoking Status Former Smoker    Quit date: 11/10/2001    Years since quitting: 19.0   Smokeless Tobacco Never Used     Social History     Substance and Sexual Activity   Alcohol Use No  Alcohol/week: 0.0 standard drinks     Social History     Substance and Sexual Activity   Drug Use No       Allergies:     Allergies   Allergen Reactions    Ibuprofen Anaphylaxis    Percocet [Oxycodone-Acetaminophen] Itching       Medications:     Prior to Admission medications    Medication Sig Start Date End Date Taking? Authorizing Provider   ALPRAZolam Prudy Feeler) 1 MG tablet Take 1 mg by mouth Patient takes 1 tablet in the morning and 1 tablet in the evening. Psychiatrist wanted patient to start cutting back so she is down to 55 pills a month.     Yes [provider]   Ascorbic Acid (VITAMIN C) 1000 MG tablet Take 1,000 mg by mouth 2 (two) times daily.   Yes [provider]   budesonide-formoterol (Symbicort) 80-4.5 MCG/ACT inhaler Inhale 2 puffs into the lungs 2 (two) times daily 02/25/20  Yes [name], FNP   Cholecalciferol (VITAMIN D3) 1000 UNIT/SPRAY Liquid Take 1 spray by mouth daily.   Yes [provider]   DULoxetine (CYMBALTA) 60 MG capsule Take 60 mg by mouth daily.   Yes [provider]   fluticasone (FLONASE) 50 MCG/ACT nasal spray 2 sprays in each nostril 1-2 times a day 07/07/18  Yes [name], FNP   folic acid (FOLVITE) 1 MG tablet TAKE 1 TABLET DAILY 11/05/19  Yes  [name], FNP   losartan (Cozaar) 25 MG tablet Take 1 tablet (25 mg total) by mouth daily 01/24/20  Yes [name], FNP   montelukast (SINGULAIR) 10 MG tablet TAKE 1 TABLET NIGHTLY 01/18/20  Yes [name], FNP   naloxone [facility]) 4 MG/0.1ML nasal spray 1 spray intranasally. If pt does not respond or relapses into respiratory depression call 911. Give additional doses every 2-3 min. 11/15/20  Yes Vida Rigger, MD   pantoprazole (PROTONIX) 40 MG tablet Take 1 tablet (40 mg total) by mouth daily 10/30/20  Yes [name], FNP   simvastatin (ZOCOR) 20 MG tablet TAKE 1 TABLET NIGHTLY 11/23/19  Yes Philipp Deputy, NP   vitamin B-12 (CYANOCOBALAMIN) 1000 MCG tablet Take 1,000 mcg by mouth daily.   Yes [provider]   vitamin D, ergocalciferol, (DRISDOL) 50000 UNIT Cap TAKE 1 CAPSULE ONCE A WEEK 04/25/20  Yes [name], FNP   HYDROcodone-acetaminophen [facility]) 5-325 MG per tablet Take 1 tablet by mouth every 6 (six) hours as needed for Pain 11/15/20   Vida Rigger, MD   lidocaine (Lidoderm) 5 % Place 2 patches onto the skin every 24 hours Remove & Discard patch within 12 hours or as directed by MD 11/21/20   [name], FNP   tiZANidine (Zanaflex) 4 MG tablet Take 1 tablet (4 mg total) by mouth every 6 (six) hours as needed (muscle pain) 11/21/20   [name], FNP       Physical Exam:     Vitals:    11/21/20 1130   BP: 117/66   Pulse: 73   Resp: 16   Temp: 97.3 F (36.3 C)   SpO2: 99%     Body mass index is 31.57 kg/m.    Physical Exam  Vitals reviewed.   Constitutional:       General: She is not in acute distress.     Appearance: Normal appearance. She is obese. She is not ill-appearing.   HENT:      Head: Normocephalic and  atraumatic.   Eyes:      Extraocular Movements: Extraocular movements intact.   Cardiovascular:      Rate and Rhythm: Normal rate and regular rhythm.      Heart sounds: Normal heart sounds. No murmur heard.  Pulmonary:      Effort: Pulmonary effort is normal.      Breath sounds: Normal  breath sounds.   Chest:      Chest wall: Tenderness (Tenderness at the site of bruising as well as all along the sternal borders bilaterally.) present.       Musculoskeletal:         General: Normal range of motion.      Cervical back: Normal range of motion.   Skin:     General: Skin is warm and dry.   Neurological:      General: No focal deficit present.      Mental Status: She is alert and oriented to person, place, and time.   Psychiatric:         Mood and Affect: Mood normal.         Behavior: Behavior normal.         Thought Content: Thought content normal.         Assessment:     1. Chest wall pain  tiZANidine (Zanaflex) 4 MG tablet    lidocaine (Lidoderm) 5 %   2. Essential hypertension  CBC and differential    Comprehensive metabolic panel    TSH   3. Folic acid deficiency  Vitamin B12 And Folate   4. Vitamin B12 deficiency  Vitamin B12 And Folate   5. Vitamin D deficiency  Vitamin D,25 OH, Total   6. Mixed hyperlipidemia  Lipid panel       Plan:   Hasini will return for fasting blood work which will include CBC, CMP, lipid panel, TSH, vitamin D 25-hydroxy and vitamin B12/folate.    Rx: Lidoderm 5% patches.  She may apply up to 2 patches in the area of pain, to be applied for 12 hours and then left off for 12 hours.    Rx: Zanaflex 4 mg for use as needed for muscular pain.    Additionally, I recommend applying topical Arnica gel.    Follow-up by phone or MyChart after labs have been reviewed.    Signed by [name] FNP    This note was completed using Dragon Medical speech recognition software. Grammatical errors, random word insertions, pronoun errors and incomplete sentences are occasional consequences of this technology due to software limitations. If there are questions or concerns about the content of this note or information contained within the body of this dictation they should be addressed with the provider for clarification.

## 2020-11-24 ENCOUNTER — Encounter (RURAL_HEALTH_CENTER): Payer: Self-pay

## 2020-11-24 ENCOUNTER — Telehealth (RURAL_HEALTH_CENTER): Payer: Self-pay

## 2020-11-24 NOTE — Telephone Encounter (Addendum)
[facility] denied RX for lidocaine patches - kl,ma sent patient mychart message to inform - kl,ma

## 2020-11-24 NOTE — Telephone Encounter (Signed)
RX for lidocaine patches were denied by [facility] - completing APPEAL and faxing to [facility] - kl,ma

## 2020-11-24 NOTE — Telephone Encounter (Signed)
Received fax requesting more information and PCP signature - will fax all documentation with new request - kl,ma

## 2020-11-27 ENCOUNTER — Telehealth (RURAL_HEALTH_CENTER): Payer: Self-pay

## 2020-11-27 NOTE — Telephone Encounter (Signed)
error 

## 2020-12-01 ENCOUNTER — Ambulatory Visit (RURAL_HEALTH_CENTER): Payer: Commercial Managed Care - POS

## 2020-12-02 ENCOUNTER — Telehealth (RURAL_HEALTH_CENTER): Payer: Self-pay

## 2020-12-02 NOTE — Telephone Encounter (Signed)
RX for Lidoderm patch is denied and appeal is denied- kl,ma

## 2020-12-10 ENCOUNTER — Other Ambulatory Visit (RURAL_HEALTH_CENTER): Payer: Self-pay | Admitting: Family

## 2020-12-10 DIAGNOSIS — I10 Essential (primary) hypertension: Secondary | ICD-10-CM

## 2020-12-12 ENCOUNTER — Ambulatory Visit: Payer: Commercial Managed Care - POS

## 2020-12-12 ENCOUNTER — Other Ambulatory Visit (RURAL_HEALTH_CENTER): Payer: Self-pay | Admitting: Family

## 2020-12-12 ENCOUNTER — Other Ambulatory Visit
Admission: RE | Admit: 2020-12-12 | Discharge: 2020-12-12 | Disposition: A | Discharge status: Home / Self Care | Payer: Commercial Managed Care - POS | Source: Ambulatory Visit | Attending: Family | Admitting: Family

## 2020-12-12 DIAGNOSIS — E782 Mixed hyperlipidemia: Secondary | ICD-10-CM

## 2020-12-12 DIAGNOSIS — I10 Essential (primary) hypertension: Secondary | ICD-10-CM

## 2020-12-12 DIAGNOSIS — E538 Deficiency of other specified B group vitamins: Secondary | ICD-10-CM

## 2020-12-12 DIAGNOSIS — E559 Vitamin D deficiency, unspecified: Secondary | ICD-10-CM

## 2020-12-13 LAB — CBC AND DIFFERENTIAL
Basophils %: 0 % (ref 0.0–3.0)
Basophils Absolute: 0 10*3/uL (ref 0.0–0.3)
Eosinophils %: 0 % (ref 0.0–7.0)
Eosinophils Absolute: 0 10*3/uL (ref 0.0–0.8)
Hematocrit: 50.1 % — ABNORMAL HIGH (ref 36.0–48.0)
Hemoglobin: 15.4 gm/dL (ref 12.0–16.0)
Lymphocytes Absolute: 3.5 10*3/uL (ref 0.6–5.1)
Lymphocytes: 39 % (ref 15.0–46.0)
MCH: 33 pg (ref 28–35)
MCHC: 31 gm/dL — ABNORMAL LOW (ref 32–36)
MCV: 106 fL — ABNORMAL HIGH (ref 80–100)
MPV: 9.4 fL (ref 6.0–10.0)
Monocytes Absolute: 0.4 10*3/uL (ref 0.1–1.7)
Monocytes: 4 % (ref 3.0–15.0)
Neutrophils %: 57 % (ref 42.0–78.0)
Neutrophils Absolute: 5.2 10*3/uL (ref 1.7–8.6)
PLT CT: 245 10*3/uL (ref 130–440)
RBC: 4.74 10*6/uL (ref 3.80–5.00)
RDW: 12.6 % (ref 11.0–14.0)
WBC: 9.1 10*3/uL (ref 4.0–11.0)

## 2020-12-13 LAB — LIPID PANEL
Cholesterol: 201 mg/dL — ABNORMAL HIGH (ref 75–199)
Coronary Heart Disease Risk: 4.1
HDL: 49 mg/dL (ref 45–65)
LDL Calculated: 134 mg/dL
Triglycerides: 91 mg/dL (ref 10–150)
VLDL: 18 (ref 0–40)

## 2020-12-13 LAB — VITAMIN B12 AND FOLATE
Folate: 15.2 ng/mL (ref 7.0–19.9)
Vitamin B-12: >2000 pg/mL — ABNORMAL HIGH (ref 213–816)

## 2020-12-13 LAB — TSH: TSH: 3.38 u[IU]/mL (ref 0.40–4.20)

## 2020-12-14 LAB — VITAMIN D,25 OH,TOTAL: Vitamin D 25-Hydroxy: 39.3 ng/mL (ref 30.0–80.0)

## 2020-12-17 ENCOUNTER — Other Ambulatory Visit (RURAL_HEALTH_CENTER): Payer: Self-pay | Admitting: Family

## 2020-12-17 ENCOUNTER — Encounter (RURAL_HEALTH_CENTER): Payer: Self-pay | Admitting: Family

## 2020-12-17 DIAGNOSIS — E875 Hyperkalemia: Secondary | ICD-10-CM

## 2020-12-18 LAB — COMPREHENSIVE METABOLIC PANEL
ALT: 9 U/L (ref 0–55)
AST (SGOT): 13 U/L (ref 10–42)
Albumin/Globulin Ratio: 1.23 Ratio (ref 0.80–2.00)
Albumin: 3.8 gm/dL (ref 3.5–5.0)
Alkaline Phosphatase: 86 U/L (ref 40–145)
Anion Gap: 26.1 mMol/L — ABNORMAL HIGH (ref 7.0–18.0)
BUN / Creatinine Ratio: 13 Ratio (ref 10.0–30.0)
BUN: 12 mg/dL (ref 7–22)
Bilirubin, Total: 0.6 mg/dL (ref 0.1–1.2)
CO2: 25 mMol/L (ref 20–30)
Calcium: 9.7 mg/dL (ref 8.5–10.5)
Chloride: 101 mMol/L (ref 98–110)
Creatinine: 0.92 mg/dL (ref 0.60–1.20)
EGFR: 66 mL/min/{1.73_m2} (ref 60–150)
Globulin: 3.1 gm/dL (ref 2.0–4.0)
Glucose: 49 mg/dL — ABNORMAL LOW (ref 71–99)
Osmolality Calculated: 288 mOsm/kg (ref 275–300)
Potassium: 6.1 mMol/L (ref 3.5–5.3)
Protein, Total: 6.9 gm/dL (ref 6.0–8.3)
Sodium: 146 mMol/L (ref 136–147)

## 2021-03-08 ENCOUNTER — Other Ambulatory Visit (RURAL_HEALTH_CENTER): Payer: Self-pay | Admitting: Family

## 2021-03-08 DIAGNOSIS — R053 Chronic cough: Secondary | ICD-10-CM

## 2021-04-30 ENCOUNTER — Emergency Department
Admission: EM | Admit: 2021-04-30 | Discharge: 2021-04-30 | Disposition: A | Discharge status: Home / Self Care | Payer: Commercial Managed Care - POS | Attending: Emergency Medicine | Admitting: Emergency Medicine

## 2021-04-30 ENCOUNTER — Emergency Department: Payer: Commercial Managed Care - POS

## 2021-04-30 DIAGNOSIS — X509XXA Other and unspecified overexertion or strenuous movements or postures, initial encounter: Secondary | ICD-10-CM | POA: Insufficient documentation

## 2021-04-30 DIAGNOSIS — S63501A Unspecified sprain of right wrist, initial encounter: Secondary | ICD-10-CM | POA: Insufficient documentation

## 2021-04-30 NOTE — ED Provider Notes (Signed)
Event Date/Time    Physician/Midlevel provider first contact with patient: 04/30/21 1229         History     Chief Complaint   Patient presents with    Hand Injury    Wrist Pain     Patient presents for evaluation of right wrist injury.  Patient fell yesterday onto outstretched arms.  She notes most of the weight went onto the right side.  She is complaining pain in the volar aspect of the right wrist and lower right hand.  She notes that seem to be worse with motion.  She denies any swelling or deformity.  Has no distal numbness or tingling.  Does have a slight bruise at the base of the right hand.  Patient denies any recent temperature, fever, URI symptoms, cough, shortness of breath.       Nursing (triage) note reviewed for the following pertinent information:  "I fell yesterday afternoon and put my hands out to break the fall. I don't think it's broken but this right hand/wrist doesn't feel right and took the brunt of the weight"  Full ROM in right hand  Rates pain 5/10    Past Medical History:   Diagnosis Date    Anxiety     Arthritis     Depression     Discharge from nipple     Disease of lung     Gastroesophageal reflux disease     Hyperlipidemia        Past Surgical History:   Procedure Laterality Date    ANKLE SURGERY Right 02/02/2016    APPENDECTOMY (OPEN)      BREAST BIOPSY      breast biospy Right     COLONOSCOPY, POLYPECTOMY N/A 11/16/2016    Procedure: COLONOSCOPY, POLYPECTOMY;  Surgeon: Olena Leatherwood, MD;  Location: [facility];  Service: Gastroenterology;  Laterality: N/A;    FRACTURE SURGERY Left     REPLACEMENT TOTAL KNEE Left     SHOULDER ARTHROSCOPY Right     TONSILLECTOMY Bilateral        Family History   Problem Relation Age of Onset    Hypertension Mother     Stroke Mother     Osteoporosis Mother     Asthma Mother     Hypertension Father     Diabetes Father     Heart disease Father     Hypertension Sister     Hypertension Sister     Hypertension Sister     Heart attack Brother     Heart  attack Paternal Grandmother        Social  Social History     Tobacco Use    Smoking status: Former     Types: Cigarettes     Quit date: 11/10/2001     Years since quitting: 19.4    Smokeless tobacco: Never   Vaping Use    Vaping Use: Never used   Substance Use Topics    Alcohol use: No     Alcohol/week: 0.0 standard drinks    Drug use: No       .     Allergies   Allergen Reactions    Ibuprofen Anaphylaxis    Percocet [Oxycodone-Acetaminophen] Itching       Home Medications       Med List Status: Complete Set By: Lewayne Bunting, RN at 04/30/2021 11:59 AM              ALPRAZolam Prudy Feeler) 1 MG tablet  Take 1 mg by mouth Patient takes 1 tablet in the morning and 1 tablet in the evening. Psychiatrist wanted patient to start cutting back so she is down to 55 pills a month.       Ascorbic Acid (VITAMIN C) 1000 MG tablet     Take 1,000 mg by mouth 2 (two) times daily.     budesonide-formoterol (Symbicort) 80-4.5 MCG/ACT inhaler     Inhale 2 puffs into the lungs 2 (two) times daily     Cholecalciferol (VITAMIN D3) 1000 UNIT/SPRAY Liquid     Take 1 spray by mouth daily.     DULoxetine (CYMBALTA) 60 MG capsule     Take 60 mg by mouth daily.     fluticasone (FLONASE) 50 MCG/ACT nasal spray     2 sprays in each nostril 1-2 times a day     folic acid (FOLVITE) 1 MG tablet     TAKE 1 TABLET DAILY     losartan (COZAAR) 25 MG tablet     TAKE 1 TABLET DAILY     montelukast (SINGULAIR) 10 MG tablet     TAKE 1 TABLET NIGHTLY     naloxone (NARCAN) 4 MG/0.1ML nasal spray     1 spray intranasally. If pt does not respond or relapses into respiratory depression call 911. Give additional doses every 2-3 min.     pantoprazole (PROTONIX) 40 MG tablet     Take 1 tablet (40 mg total) by mouth daily     simvastatin (ZOCOR) 20 MG tablet     TAKE 1 TABLET NIGHTLY     tiZANidine (Zanaflex) 4 MG tablet     Take 1 tablet (4 mg total) by mouth every 6 (six) hours as needed (muscle pain)     vitamin B-12 (CYANOCOBALAMIN) 1000 MCG tablet     Take  1,000 mcg by mouth daily.     vitamin B-12 (CYANOCOBALAMIN) injection 1,000 mcg     1,000 mcg, Intramuscular, Every 14 days, First dose on Wed 04/30/20 at 1900     vitamin D, ergocalciferol, (DRISDOL) 50000 UNIT Cap     TAKE 1 CAPSULE ONCE A WEEK               Flagged for Removal               lidocaine (Lidoderm) 5 %     Place 2 patches onto the skin every 24 hours Remove & Discard patch within 12 hours or as directed by MD             Review of Systems   Constitutional:  Negative for fever.   HENT:  Negative for congestion.    Respiratory:  Negative for cough and shortness of breath.    Skin:  Negative for rash and wound.   Neurological:  Negative for weakness and numbness.     Physical Exam    BP: 136/65, Heart Rate: 75, Temp: (!) 96.9 F (36.1 C), Resp Rate: 18, SpO2: 99 %, Weight: 77.1 kg    Physical Exam  Vitals and nursing note reviewed.   Constitutional:       General: She is not in acute distress.     Appearance: Normal appearance.   Musculoskeletal:         General: Tenderness present. No swelling or deformity. Normal range of motion.      Right wrist: Tenderness present. No swelling or bony tenderness. Normal range of motion. Normal pulse.  Hands:    Skin:     General: Skin is warm and dry.      Capillary Refill: Capillary refill takes less than 2 seconds.      Findings: No erythema or rash.   Neurological:      Mental Status: She is alert.      Sensory: No sensory deficit.      Motor: No weakness.   Psychiatric:         Mood and Affect: Mood normal.         Behavior: Behavior normal.       Labs  Results       ** No results found for the last 24 hours. **            Radiologic Studies  Radiology Results (24 Hour)       Procedure Component Value Units Date/Time    XR Hand Right PA Lateral And Oblique [[id number]] Collected: 04/30/21 1234    Order Status: Completed Updated: 04/30/21 1236    Narrative:      INDICATION:  fell pain in right hand and wrist    EXAMINATION:   XR HAND RIGHT PA LATERAL AND  OBLIQUE    COMPARISON:   None available.    FINDINGS:       Views: 3 views of the right hand are obtained.      Osseous structures/Joint spaces: No acute fracture. Mild arthritic changes.    Soft tissues: No focal swelling. No radiopaque foreign body.      Impression:      IMPRESSION:     No acute osseous abnormality of the right hand.    ReadingStation:[id number]    XR Wrist Right 3+ Views [[id number]] Collected: 04/30/21 1228    Order Status: Completed Updated: 04/30/21 1235    Narrative:      INDICATION:  pain in right wrist since a fall yesterday    EXAMINATION:   XR WRIST RIGHT 3+ VIEWS    COMPARISON:   None available.    FINDINGS:       Views: 4 views of the right wrist are obtained.      Osseous structures/Joint spaces: No acute fracture. Minimal arthritic changes.    Soft tissues:  No focal swelling. No radiopaque foreign body.      Impression:      IMPRESSION:     No acute osseous abnormality of the right wrist. If pain persists, repeat radiographs in 7-10 days or MRI is recommended.    ReadingStation:[id number]        .    EKG Results  Last EKG Result       None              MDM and ED Course     ED Medication Orders (From admission, onward)      None               MDM  Patient with fall yesterday onto right wrist.  Complaint of pain in the volar right wrist.  Examination shows patient be afebrile stable vital signs.  There is tenderness to the volar right wrist but full range of motion without swelling and neurovascular intact.  No deformity.  Plans to obtain imaging.    Imaging returns and demonstrates no acute bony changes.  Patient most consistent with a sprain of her right wrist.  Recommend patient use over-the-counter Tylenol and ice.  Discussed with her expected course and should she not improve as expected, worsen  or change concerns to follow-up.  Patient voices understanding, agrees with plan and questions were answered.                 Procedures    Clinical Impression & Disposition     Clinical  Impression  Final diagnoses:   Sprain of right wrist, initial encounter        ED Disposition       ED Disposition   Discharge    Condition   --    Date/Time   Thu Apr 30, 2021  1:03 PM    Comment   Rowynn Mcweeney [facility] discharge to home/self care.    Condition at disposition: Stable                  New Prescriptions    No medications on file                   Chucky May, MD  04/30/21 [phone]

## 2021-05-10 ENCOUNTER — Emergency Department
Admission: EM | Admit: 2021-05-10 | Discharge: 2021-05-10 | Disposition: A | Discharge status: Home / Self Care | Payer: Commercial Managed Care - POS | Attending: Emergency Medicine | Admitting: Emergency Medicine

## 2021-05-10 DIAGNOSIS — S0003XA Contusion of scalp, initial encounter: Secondary | ICD-10-CM | POA: Insufficient documentation

## 2021-05-10 DIAGNOSIS — W01198A Fall on same level from slipping, tripping and stumbling with subsequent striking against other object, initial encounter: Secondary | ICD-10-CM | POA: Insufficient documentation

## 2021-05-10 DIAGNOSIS — S0083XA Contusion of other part of head, initial encounter: Secondary | ICD-10-CM | POA: Insufficient documentation

## 2021-05-10 NOTE — ED Provider Notes (Signed)
Event Date/Time    Physician/Midlevel provider first contact with patient: 05/10/21 1516         History     Chief Complaint   Patient presents with    Fall    Head Injury    Headache     Patient presents for evaluation of head injury.  Patient about an hour prior to arrival slipped on a rug and reports and hit her head on the side of her house.  Patient did not lose consciousness.  She complained pain on the left side of her head to the posterior area.  She does have a swollen area.  Patient is complaining of persistent headache.  She does not have any visual changes, neck pain, nausea or vomiting.  Patient without any recent head injury.  She has not had any recent temperature, fever, URI symptoms, cough or shortness of breath.       Nursing (triage) note reviewed for the following pertinent information:  "I fell off a porch about an hour ago and hit my head. I have a headache and I'm a little bit dizzy"  She denies any nausea, vomiting, ataxia or blurry vision.    Past Medical History:   Diagnosis Date    Anxiety     Arthritis     Depression     Discharge from nipple     Disease of lung     Gastroesophageal reflux disease     Hyperlipidemia        Past Surgical History:   Procedure Laterality Date    ANKLE SURGERY Right 02/02/2016    APPENDECTOMY (OPEN)      BREAST BIOPSY      breast biospy Right     COLONOSCOPY, POLYPECTOMY N/A 11/16/2016    Procedure: COLONOSCOPY, POLYPECTOMY;  Surgeon: Olena Leatherwood, MD;  Location: [facility];  Service: Gastroenterology;  Laterality: N/A;    FRACTURE SURGERY Left     REPLACEMENT TOTAL KNEE Left     SHOULDER ARTHROSCOPY Right     TONSILLECTOMY Bilateral        Family History   Problem Relation Age of Onset    Hypertension Mother     Stroke Mother     Osteoporosis Mother     Asthma Mother     Hypertension Father     Diabetes Father     Heart disease Father     Hypertension Sister     Hypertension Sister     Hypertension Sister     Heart attack Brother     Heart attack  Paternal Grandmother        Social  Social History     Tobacco Use    Smoking status: Former     Types: Cigarettes     Quit date: 11/10/2001     Years since quitting: 19.5    Smokeless tobacco: Never   Vaping Use    Vaping Use: Never used   Substance Use Topics    Alcohol use: No     Alcohol/week: 0.0 standard drinks    Drug use: No       .     Allergies   Allergen Reactions    Ibuprofen Anaphylaxis    Percocet [Oxycodone-Acetaminophen] Itching       Home Medications       Med List Status: Complete Set By: Lewayne Bunting, RN at 05/10/2021  2:42 PM              ALPRAZolam Prudy Feeler)  1 MG tablet     Take 1 mg by mouth Patient takes 1 tablet in the morning and 1 tablet in the evening. Psychiatrist wanted patient to start cutting back so she is down to 55 pills a month.       Ascorbic Acid (VITAMIN C) 1000 MG tablet     Take 1,000 mg by mouth 2 (two) times daily.     budesonide-formoterol (Symbicort) 80-4.5 MCG/ACT inhaler     Inhale 2 puffs into the lungs 2 (two) times daily     Cholecalciferol (VITAMIN D3) 1000 UNIT/SPRAY Liquid     Take 1 spray by mouth daily.     DULoxetine (CYMBALTA) 60 MG capsule     Take 60 mg by mouth daily.     fluticasone (FLONASE) 50 MCG/ACT nasal spray     2 sprays in each nostril 1-2 times a day     folic acid (FOLVITE) 1 MG tablet     TAKE 1 TABLET DAILY     losartan (COZAAR) 25 MG tablet     TAKE 1 TABLET DAILY     montelukast (SINGULAIR) 10 MG tablet     TAKE 1 TABLET NIGHTLY     naloxone (NARCAN) 4 MG/0.1ML nasal spray     1 spray intranasally. If pt does not respond or relapses into respiratory depression call 911. Give additional doses every 2-3 min.     pantoprazole (PROTONIX) 40 MG tablet     Take 1 tablet (40 mg total) by mouth daily     simvastatin (ZOCOR) 20 MG tablet     TAKE 1 TABLET NIGHTLY     vitamin B-12 (CYANOCOBALAMIN) 1000 MCG tablet     Take 1,000 mcg by mouth daily.     vitamin B-12 (CYANOCOBALAMIN) injection 1,000 mcg     1,000 mcg, Intramuscular, Every 14 days, First  dose on Wed 04/30/20 at 1900     vitamin D, ergocalciferol, (DRISDOL) 50000 UNIT Cap     TAKE 1 CAPSULE ONCE A WEEK               Flagged for Removal               lidocaine (Lidoderm) 5 %     Place 2 patches onto the skin every 24 hours Remove & Discard patch within 12 hours or as directed by MD     tiZANidine (Zanaflex) 4 MG tablet     Take 1 tablet (4 mg total) by mouth every 6 (six) hours as needed (muscle pain)             Review of Systems   Constitutional:  Negative for fever.   HENT:  Negative for congestion.    Eyes:  Negative for redness and visual disturbance.   Respiratory:  Negative for cough and shortness of breath.    Musculoskeletal:  Negative for back pain and neck pain.   Neurological:  Positive for headaches (Left posterior at site of injury). Negative for dizziness, syncope, weakness, light-headedness and numbness.     Physical Exam    BP: 146/71, Heart Rate: 64, Temp: (!) 62.6 F (17 C), Resp Rate: 17, SpO2: 98 %, Weight: 76.2 kg    Physical Exam  Vitals and nursing note reviewed.   Constitutional:       General: She is not in acute distress.     Appearance: Normal appearance.   HENT:      Head: Normocephalic. Contusion present. No raccoon eyes, Battle's sign  or abrasion.        Right Ear: Tympanic membrane, ear canal and external ear normal.      Left Ear: Tympanic membrane, ear canal and external ear normal.      Nose: Nose normal. No congestion or rhinorrhea.      Mouth/Throat:      Mouth: Mucous membranes are moist.      Pharynx: Oropharynx is clear. No oropharyngeal exudate or posterior oropharyngeal erythema.   Eyes:      General: No scleral icterus.        Right eye: No discharge.         Left eye: No discharge.      Extraocular Movements: Extraocular movements intact.      Conjunctiva/sclera: Conjunctivae normal.      Pupils: Pupils are equal, round, and reactive to light.   Musculoskeletal:      Cervical back: Normal range of motion and neck supple. No tenderness.   Neurological:       Mental Status: She is alert.       Labs  Results       ** No results found for the last 24 hours. **            Radiologic Studies  Radiology Results (24 Hour)       ** No results found for the last 24 hours. **        .    EKG Results  Last EKG Result       None              MDM and ED Course     ED Medication Orders (From admission, onward)      None               MDM  Patient with head injury just prior to arrival.  She had ground-level fall when she had her head up the side of a building.  There is no loss conscious.  Examination shows patient be afebrile stable vital signs.  There is tenderness with swelling in the left posterior scalp area.  No other focal findings are noted.    Discussed with patient significant other that I do not feel she has any internal head injury.  She is not having focal neurological changes and the McCombs injury was minor.  She is having a hematoma in the area well as some contusion.  I do not see signs of concussion at this point as well.  Discussed with patient worsening signs symptoms and should these occur any concerns at all to return.  She voices understanding, agrees with plan and questions were answered.                 Procedures    Clinical Impression & Disposition     Clinical Impression  Final diagnoses:   Hematoma of scalp, initial encounter   Contusion of other part of head, initial encounter        ED Disposition       ED Disposition   Discharge    Condition   --    Date/Time   Sun May 10, 2021  3:25 PM    Comment   Sha Burling [facility] discharge to home/self care.    Condition at disposition: Stable                  New Prescriptions    No medications on file  Chucky May, MD  05/10/21 [phone]

## 2021-07-01 ENCOUNTER — Other Ambulatory Visit (RURAL_HEALTH_CENTER): Payer: Self-pay | Admitting: Family

## 2021-07-01 ENCOUNTER — Ambulatory Visit
Admission: RE | Admit: 2021-07-01 | Discharge: 2021-07-01 | Disposition: A | Discharge status: Home / Self Care | Payer: Medicare Other | Source: Ambulatory Visit | Attending: Family | Admitting: Family

## 2021-07-01 DIAGNOSIS — R200 Anesthesia of skin: Secondary | ICD-10-CM

## 2021-07-01 DIAGNOSIS — R202 Paresthesia of skin: Secondary | ICD-10-CM | POA: Insufficient documentation

## 2021-07-06 ENCOUNTER — Encounter: Payer: Self-pay | Admitting: Family

## 2021-07-06 DIAGNOSIS — Z1231 Encounter for screening mammogram for malignant neoplasm of breast: Secondary | ICD-10-CM

## 2021-07-10 ENCOUNTER — Ambulatory Visit
Admission: RE | Admit: 2021-07-10 | Discharge: 2021-07-10 | Disposition: A | Discharge status: Home / Self Care | Payer: Medicare Other | Source: Ambulatory Visit | Attending: Family | Admitting: Family

## 2021-07-10 DIAGNOSIS — Z1231 Encounter for screening mammogram for malignant neoplasm of breast: Secondary | ICD-10-CM | POA: Insufficient documentation

## 2021-12-17 ENCOUNTER — Emergency Department: Payer: Medicare Other

## 2021-12-17 ENCOUNTER — Emergency Department
Admission: EM | Admit: 2021-12-17 | Discharge: 2021-12-17 | Disposition: A | Discharge status: Home / Self Care | Payer: Medicare Other | Attending: Emergency Medicine | Admitting: Emergency Medicine

## 2021-12-17 DIAGNOSIS — R200 Anesthesia of skin: Secondary | ICD-10-CM | POA: Insufficient documentation

## 2021-12-17 DIAGNOSIS — R42 Dizziness and giddiness: Secondary | ICD-10-CM

## 2021-12-17 DIAGNOSIS — H538 Other visual disturbances: Secondary | ICD-10-CM | POA: Insufficient documentation

## 2021-12-17 DIAGNOSIS — K219 Gastro-esophageal reflux disease without esophagitis: Secondary | ICD-10-CM | POA: Insufficient documentation

## 2021-12-17 DIAGNOSIS — E785 Hyperlipidemia, unspecified: Secondary | ICD-10-CM | POA: Insufficient documentation

## 2021-12-17 LAB — CBC AND DIFFERENTIAL
Basophils %: 1.4 % (ref 0.0–3.0)
Basophils Absolute: 0.1 10*3/uL (ref 0.0–0.3)
Eosinophils %: 1.8 % (ref 0.0–7.0)
Eosinophils Absolute: 0.1 10*3/uL (ref 0.0–0.8)
Hematocrit: 49.3 % — ABNORMAL HIGH (ref 36.0–48.0)
Hemoglobin: 15.5 gm/dL (ref 12.0–16.0)
Lymphocytes Absolute: 3 10*3/uL (ref 0.6–5.1)
Lymphocytes: 38.3 % (ref 15.0–46.0)
MCH: 31 pg (ref 28–35)
MCHC: 32 gm/dL (ref 31–36)
MCV: 99 fL (ref 80–100)
MPV: 8.7 fL (ref 6.0–10.0)
Monocytes Absolute: 0.3 10*3/uL (ref 0.1–1.7)
Monocytes: 4.3 % (ref 3.0–15.0)
Neutrophils %: 54.2 % (ref 42.0–78.0)
Neutrophils Absolute: 4.3 10*3/uL (ref 1.7–8.6)
PLT CT: 258 10*3/uL (ref 130–440)
RBC: 4.97 10*6/uL (ref 3.80–5.00)
RDW: 12.6 % (ref 10.5–14.5)
WBC: 8 10*3/uL (ref 4.0–11.0)

## 2021-12-17 LAB — COMPREHENSIVE METABOLIC PANEL
ALT: 10 U/L (ref 0–55)
AST (SGOT): 14 U/L (ref 10–42)
Albumin/Globulin Ratio: 1.09 Ratio (ref 0.80–2.00)
Albumin: 3.6 gm/dL (ref 3.5–5.0)
Alkaline Phosphatase: 78 U/L (ref 40–145)
Anion Gap: 14.8 mMol/L (ref 7.0–18.0)
BUN / Creatinine Ratio: 12.3 Ratio (ref 10.0–30.0)
BUN: 10 mg/dL (ref 7–22)
Bilirubin, Total: 0.5 mg/dL (ref 0.1–1.2)
CO2: 28 mMol/L (ref 20–30)
Calcium: 9.2 mg/dL (ref 8.5–10.5)
Chloride: 104 mMol/L (ref 98–110)
Creatinine: 0.81 mg/dL (ref 0.60–1.20)
EGFR: 81 mL/min/{1.73_m2} (ref 60–150)
Globulin: 3.3 gm/dL (ref 2.0–4.0)
Glucose: 134 mg/dL — ABNORMAL HIGH (ref 71–99)
Osmolality Calculated: 286 mOsm/kg (ref 275–300)
Potassium: 3.8 mMol/L (ref 3.5–5.3)
Protein, Total: 6.9 gm/dL (ref 6.0–8.3)
Sodium: 143 mMol/L (ref 136–147)

## 2021-12-17 LAB — ECG 12-LEAD
P Wave Axis: 34 deg
P-R Interval: 166 ms
Q-T Interval(Corrected): 425 ms
QRS Axis: -12 deg
T Axis: 37 years

## 2021-12-17 LAB — SEDIMENTATION RATE: Sed Rate: 5 mm/hr (ref 0–30)

## 2021-12-17 LAB — C-REACTIVE PROTEIN: C-Reactive Protein: 0.15 mg/dL (ref 0.02–0.80)

## 2021-12-17 MED ORDER — MECLIZINE HCL 25 MG PO TABS
25.0000 mg | ORAL_TABLET | Freq: Four times a day (QID) | ORAL | 0 refills | Status: AC | PRN
Start: 2021-12-17 — End: ?

## 2021-12-17 MED ORDER — ALPRAZOLAM 0.5 MG PO TABS
1.0000 mg | ORAL_TABLET | Freq: Once | ORAL | Status: AC
Start: 2021-12-17 — End: 2021-12-17
  Administered 2021-12-17: 1 mg via ORAL

## 2021-12-17 MED ORDER — ALPRAZOLAM 0.5 MG PO TABS
ORAL_TABLET | ORAL | Status: AC
Start: 2021-12-17 — End: ?
  Filled 2021-12-17: qty 2

## 2021-12-17 NOTE — ED Provider Notes (Signed)
[facility] EMERGENCY DEPARTMENT  [place], [place]       Voice recognition software was used to generate this report. Every attempt was made at correction, however, word substitution, grammatical, and transcription errors may occur.     Event Date/Time    Physician/Midlevel provider first contact with patient: 12/17/21 1100         FINAL DIAGNOSIS:  1. Blurred vision, right eye        2. Right upper extremity numbness        3. Dizziness              ASSESSMENT/DIFFERENTIAL DIAGNOSIS:  Alicia Barry is a 66 y.o. female with right eye blurry vision, improving with some right upper extremity numbness which has resolved and some dizziness.  No specific source for this found. No definitive evidence of ischemic, embolic, surgical, bacterial disease       DIFFERENTIAL DIAGNOSES that were considered in the evaluation of this patient include, but were not limited to, ischemic stroke, intracranial hemorrhage, neoplasm, transient ischemic attack, head injury, meningitis, encephalitis, seizure, metabolic disturbance, migraine, anxiety, vertigo, ocular problem, among others. -     ED COURSE & MEDICAL DECISION MAKING:    The patient remained stable throughout.  She had an extensive and stepwise assessment to rule out stroke.  This was negative.  She was discharged home ultimately and was improving.  I had her follow-up with the eye doctor and told her she needed close follow-up.   -      Medical Decision Making  Patient with monocular blurry vision, off-and-on dizziness and who had some right arm numbness.  This was concerning for stroke syndrome and so she had an extensive and stepwise assessment to determine the etiology of this.  She had a reassuring work-up, however.  She did have some improvement and was discharged home with symptomatic care.  Contacted the eye doctor to see if she can get into see them quickly to confirm that the eye itself looked okay.    Problems Addressed:  Blurred vision, right eye:  acute illness or injury  Dizziness: acute illness or injury  Right upper extremity numbness: acute illness or injury    Amount and/or Complexity of Data Reviewed  External Data Reviewed: labs, radiology and notes.     Details: Prior ED and clinic notes were reviewed to better delineate the patient's past and recent medical history. Includes ECG, imaging, and laboratory reviews if indicated above. This is also to delineate past and recent medical history as well as assess for clincally significant changes.  Labs: ordered. Decision-making details documented in ED Course.  Radiology: ordered and independent interpretation performed. Decision-making details documented in ED Course.     Details: CT of the head ordered to rule out intracranial hemorrhage, intracranial mass, stroke: No acute abnormalities  MRI brain ordered to rule out stroke, mass showed no acute abnormalities  ECG/medicine tests: ordered and independent interpretation performed. Decision-making details documented in ED Course.     Details: ECG ordered to assess for ischemia, arrhythmia, electrolyte effects: Negative.  No old to compare    Risk  Prescription drug management.        Visit Vitals  BP 121/75   Pulse 71   Temp 97.3 F (36.3 C) (Tympanic)   Resp 16   Ht 1.676 m   Wt 69.4 kg   SpO2 98%   BMI 24.72 kg/m     ED Course as of 12/19/21 1401   Thu  Dec 17, 2021   1230 Advised patient of the results so far which are reassuring.  However, she may have a posterior fossa issue.  She is agreeable to remaining here until MRI can be obtained later on in the afternoon. [KF]   1300 Reviewed and interpreted ECG.  No STEMI. [KF]   1537 Rechecked.  Discussed results.  Her vision has improved.  She is not having any focal complaints but still has some dizziness.  Left a message with Dr. Fidel Levy to arrange follow-up for evaluation of her vision.  Recommend close follow-up as she may need referral to neurology [KF]      ED Course User Index  [KF] Vida Rigger,  MD        RISK ASSESSMENT: The patient's records were checked through the [facility] (and other states where applicable and available), used in accordance with official notices posted in the Emergency Department and waiting room in accordance with law. This is to screen for misuse, overuse, doctor-shopping, and/or diversion. This is a patient safety issue.       Patient's evaluation indicates the patient does not appear to have a need for inpatient workup and/or management. No further testing indicated acutely. Prior visits/ charts: reviewed. Nurses' notes reviewed. Pertinent lab and radiology data was reviewed by me and discussed with patient and family, if present. I had a detailed discussion about the diagnosis, workup and treatment plan, and we are in agreement.    MEDICATIONS ORDERED THIS VISIT:     Medications   ALPRAZolam (XANAX) tablet 1 mg (1 mg Oral Given 12/17/21 1252)       PROCEDURES:   Procedures    CONSULTATION:   None     DISPOSITION:    Discharge from the Emergency Department.      Discharge Medication List as of 12/17/2021  3:37 PM        START taking these medications    Details   meclizine (ANTIVERT) 25 MG tablet Take 1 tablet (25 mg) by mouth every 6 (six) hours as needed for Dizziness, Starting Thu 12/17/2021, Normal             CONDITION AT DISPOSITION: Good, improving. Stable.         HISTORIAN:  History obtained from chart review.  Historian(s) judged to be reliable.    CHIEF COMPLAINT:  Chief Complaint   Patient presents with    Blurred Vision    Dizziness    Numbness    "Blurred vision" according to patient    HISTORY OF PRESENT ILLNESS:    Alicia Barry is a very pleasant 66 y.o. female who complains of awakening today about 0900 with blurred vision in the right eye.  She has had some off-and-on dizziness over the past 3 days.  Yesterday she had some arm numbness on the right side.  She had a mechanical ground-level trip and fall yesterday but states she  was not having the other symptoms prior to that and did not have them immediately afterwards.  She sustained abrasions to her knees and left hand.  No loss of consciousness.  Denies any pain in her head or neck except for some upper thoracic, lower cervical region.  Specifically no temporal pain or tenderness.  She has no current focal weakness, numbness, tingling.  No chest pain or shortness of breath.  No nausea, vomiting, diarrhea.  No fever.  No double vision.  She states it felt like she had "a film"  over her eye and she thought perhaps she was just having some allergy issues.  There is no specific provoking, aggravating, relieving factor to that.   HPI -   .  REVIEW OF SYSTEMS:   Review of Systems. At least 8 systems reviewed and negative, unless otherwise specified above.    PAST MEDICAL/SURGICAL HISTORY:  Past Medical History:   Diagnosis Date    Anxiety     Arthritis     Depression     Disease of lung     Gastroesophageal reflux disease     Hyperlipidemia    .   Past Surgical History:   Procedure Laterality Date    ANKLE SURGERY Right 02/02/2016    APPENDECTOMY (OPEN)      BREAST BIOPSY      breast biospy Right     COLONOSCOPY, POLYPECTOMY N/A 11/16/2016    Procedure: COLONOSCOPY, POLYPECTOMY;  Surgeon: Olena Leatherwood, MD;  Location: [facility];  Service: Gastroenterology;  Laterality: N/A;    FRACTURE SURGERY Left     REPLACEMENT TOTAL KNEE Left     SHOULDER ARTHROSCOPY Right     TONSILLECTOMY Bilateral    .     IMMUNIZATIONS:    Immunization History   Administered Date(s) Administered    COVID-19 mRNA MONOVALENT vaccine PRIMARY SERIES 12 years and above Proofreader) 30 mcg/0.3 mL (DILUTE BEFORE USE) 11/29/2019, 12/20/2019    Influenza Vacc Quad MDCK cell cult 4 yrs & up 08/07/2015    Influenza quad 6 MOS to 64 YRS (Flulaval/Fluarix) 06/15/2017, 07/07/2018, 10/25/2019    Pneumococcal Conjugate 13-Valent 09/13/2018, 10/25/2019        FAMILY HISTORY:  Family History   Problem Relation Age of Onset     Hypertension Mother     Stroke Mother     Osteoporosis Mother     Asthma Mother     Hypertension Father     Diabetes Father     Heart disease Father     Hypertension Sister     Hypertension Sister     Hypertension Sister     Heart attack Brother     Heart attack Paternal Grandmother        HOME MEDICATIONS:  Home Medications       Med List Status: Complete Set By: Brown Human, RN at 12/17/2021 11:12 AM              ALPRAZolam Prudy Feeler) 1 MG tablet     Take 1 mg by mouth Patient takes 1 tablet in the morning and 1 tablet in the evening. Psychiatrist wanted patient to start cutting back so she is down to 55 pills a month.       Ascorbic Acid (VITAMIN C) 1000 MG tablet     Take 1,000 mg by mouth 2 (two) times daily.     budesonide-formoterol (Symbicort) 80-4.5 MCG/ACT inhaler     Inhale 2 puffs into the lungs 2 (two) times daily     Cholecalciferol (VITAMIN D3) 1000 UNIT/SPRAY Liquid     Take 1 spray by mouth daily.     DULoxetine (CYMBALTA) 60 MG capsule     Take 60 mg by mouth daily.     fluticasone (FLONASE) 50 MCG/ACT nasal spray     2 sprays in each nostril 1-2 times a day     folic acid (FOLVITE) 1 MG tablet     TAKE 1 TABLET DAILY     losartan (COZAAR) 25 MG tablet  TAKE 1 TABLET DAILY     montelukast (SINGULAIR) 10 MG tablet     TAKE 1 TABLET NIGHTLY     naloxone (NARCAN) 4 MG/0.1ML nasal spray     1 spray intranasally. If pt does not respond or relapses into respiratory depression call 911. Give additional doses every 2-3 min.     pantoprazole (PROTONIX) 40 MG tablet     Take 1 tablet (40 mg total) by mouth daily     tiZANidine (Zanaflex) 4 MG tablet     Take 1 tablet (4 mg total) by mouth every 6 (six) hours as needed (muscle pain)     vitamin B-12 (CYANOCOBALAMIN) 1000 MCG tablet     Take 1,000 mcg by mouth daily.     vitamin B-12 (CYANOCOBALAMIN) injection 1,000 mcg     1,000 mcg, Intramuscular, Every 14 days, First dose on Wed 04/30/20 at 1900          Flagged for Removal               lidocaine  (Lidoderm) 5 %     Place 2 patches onto the skin every 24 hours Remove & Discard patch within 12 hours or as directed by MD     simvastatin (ZOCOR) 20 MG tablet     TAKE 1 TABLET NIGHTLY     vitamin D, ergocalciferol, (DRISDOL) 50000 UNIT Cap     TAKE 1 CAPSULE ONCE A WEEK             ALLERGY:  Allergies   Allergen Reactions    Ibuprofen Anaphylaxis    Percocet [Oxycodone-Acetaminophen] Itching       SOCIAL HISTORY:   Social History     Tobacco Use    Smoking status: Former     Types: Cigarettes     Quit date: 11/10/2001     Years since quitting: 20.1    Smokeless tobacco: Never   Vaping Use    Vaping status: Never Used   Substance Use Topics    Alcohol use: No     Alcohol/week: 0.0 standard drinks of alcohol    Drug use: No       NOTE: If the PMH or PSH or social history sections read "Not on file" or are left blank it reflects that the histories were verbally investigated but there is no past medical or surgical history, or tobacco, alcohol or recreational drug use except as noted.    PHYSICAL EXAMINATION:  VITAL SIGNS reviewed by me:   Vitals:    12/17/21 1100   BP: 121/75   Pulse: 71   Resp: 16   Temp: 97.3 F (36.3 C)   SpO2: 98%      Reviewed by the Emergency Physician  Pulse Ox Interpretation by Emergency Physician: normal  WEIGHT:       04/30/2021    11:51 AM 05/10/2021     2:35 PM 12/17/2021    11:00 AM   Weight Monitoring   Height 167.6 cm 167.6 cm 167.6 cm   Height Method   Stated   Weight 77.111 kg 76.204 kg 69.446 kg   Weight Method   Actual   BMI (calculated) 27.5 kg/m2 27.2 kg/m2 24.8 kg/m2       CONSTITUTIONAL: Vital signs reviewed. Patient is well-appearing. Patient appears comfortable. Hydration: normal. Not toxic appearing.  HEAD: Atraumatic. Normocephalic.  EYES: Eyes are normal to inspection. Pupils equal, round, reactive to light. Discharge from eyes: none. Extraocular muscles: intact.  Fundi are benign  without papilledema.  Anterior chambers are deep and clear.  ENT: No facial swelling. External  ears normal. No discharge from ears.  Mucosa is pink and moist.  No pharyngeal edema, erythema, exudate.  No facial asymmetry  NECK: Range of motion: normal. Tenderness: none. Trachea midline. Jugular venous distention: none.  CARDIOVASCULAR: Rate: normal. Rhythm: Regular. Murmurs: none. Normal S1 S2.  RESPIRATORY/CHEST: Chest tenderness: none. Breath sounds: normal. Respiratory distress: none. Accessory muscle use: none. Work of breathing: normal.  BACK: Tenderness: none. Range of motion: normal for age. Visible abnormality: none. Palpable abnormality: none.  ABDOMEN: Soft. Distention: none. Tenderness: none. No guarding. No rebound. Bowel sounds: normal.  RECTAL: Not performed.  GENITALIA: Not examined.  UPPER EXTREMITY: No edema. No cyanosis. No clubbing. Normal range of motion.  LOWER EXTREMITY: No edema. No cyanosis. No clubbing. Normal range of motion. No calf tenderness.  NEUROLOGIC:  Awake. Alert. Strength: normal. Sensation: normal. Cranial nerves: intact. Conversation: fluent.  SKIN: Skin is normal color, warm, and dry. Skin intact. Rash: None.  LYMPHATIC: No pathologic adenopathy. No lymphangitis.  PSYCHIATRIC: Affect: anxious. Insight: normal.  Physical Exam  NIH STROKE SCALE:  Level of Consciousness 0 = Alert; keenly responsive   LOC Questions 0 = Answers both questions correctly   LOC Commands 0 = Performs both tasks correctly   Best Gaze 0 = Normal   Visual Fields 0 = No visual loss   Facial Motor 0 = Normal symmetrical movements   Arm Motor LEFT 0 = No drift; limb holds 90 (or 45) degrees for full 10 seconds   Arm Motor RIGHT 0 = No drift; limb holds 90 (or 45) degrees for full 10 seconds   Leg Motor LEFT 0 = No drift; leg holds 30-degree position for full 5 seconds   Leg Motor RIGHT 0 = No drift; leg holds 30-degree position for full 5 seconds   Limb Ataxia 0 = Absent   Sensory 0 = Normal; no sensory loss   Aphasia (object naming, vignette) 0 = No aphasia; normal   Slurred Speech 0 = Normal    Extinction/inattention 0 = No abnormality   TOTAL NIH STROKE SCORE 0         DATA REVIEWED:   LABS:      Results       Procedure Component Value Units Date/Time    C Reactive Protein [[id number]] Collected: 12/17/21 1105    Specimen: Plasma Updated: 12/17/21 1356     C-Reactive Protein 0.15 mg/dL     Sedimentation rate (ESR) [[id number]] Collected: 12/17/21 1105    Specimen: Blood Updated: 12/17/21 1342     Sed Rate 5 mm/hr     Comprehensive metabolic panel [[id number]]  (Abnormal) Collected: 12/17/21 1105    Specimen: Plasma Updated: 12/17/21 1130     Sodium 143 mMol/L      Potassium 3.8 mMol/L      Chloride 104 mMol/L      CO2 28 mMol/L      Calcium 9.2 mg/dL      Glucose 784 mg/dL      Creatinine 6.96 mg/dL      BUN 10 mg/dL      Protein, Total 6.9 gm/dL      Albumin 3.6 gm/dL      Alkaline Phosphatase 78 U/L      ALT 10 U/L      AST (SGOT) 14 U/L      Bilirubin, Total 0.5 mg/dL      Albumin/Globulin Ratio 1.09 Ratio  Anion Gap 14.8 mMol/L      BUN / Creatinine Ratio 12.3 Ratio      EGFR 81 mL/min/1.71m2      Osmolality Calculated 286 mOsm/kg      Globulin 3.3 gm/dL     CBC and differential [[id number]]  (Abnormal) Collected: 12/17/21 1105    Specimen: Blood Updated: 12/17/21 1121     WBC 8.0 K/cmm      RBC 4.97 M/cmm      Hemoglobin 15.5 gm/dL      Hematocrit 40.9 %      MCV 99 fL      MCH 31 pg      MCHC 32 gm/dL      RDW 81.1 %      PLT CT 258 K/cmm      MPV 8.7 fL      Neutrophils % 54.2 %      Lymphocytes 38.3 %      Monocytes 4.3 %      Eosinophils % 1.8 %      Basophils % 1.4 %      Neutrophils Absolute 4.3 K/cmm      Lymphocytes Absolute 3.0 K/cmm      Monocytes Absolute 0.3 K/cmm      Eosinophils Absolute 0.1 K/cmm      Basophils Absolute 0.1 K/cmm            Reviewed and interpreted by the Emergency Physician     RADIOLOGY:     Radiology Results (24 Hour)       Procedure Component Value Units Date/Time    MRI BRAIN WO CONTRAST [[id number]] Collected: 12/17/21 1502    Order Status: Completed Updated:  12/17/21 1513    Narrative:      Clinical History:  Neuro deficit, acute, stroke suspected  Right visual loss, right arm numbness, rule out stroke    Ordering Comments:   None.      Study Notes:   Neuro deficit, acute, stroke suspected, Right visual loss, right arm numbness, rule out stroke     Examination:  Multiplanar MRI of the brain was performed without contrast using standard departmental protocol.    Comparison:  CT head without contrast December 17, 2021 at 11:20 AM    Findings:  BRAIN PARENCHYMA: There are multiple nonspecific small foci of FLAIR and T2 hyperintense signal in the periventricular white matter of the cerebrum and bilateral pons in the brainstem. It is mild in the periventricular white matter and moderate in the   pons.    There is no associated restricted diffusion to suggest acute infarction or acute demyelinating disease.    There is no acute intracranial hemorrhage, mass or mass effect.    The rest of the brain and brainstem are unremarkable.    VENTRICLES/EXTRA-AXIAL SPACES: No hydrocephalus or extra-axial fluid collections.    EXTRACRANIAL STRUCTURES: Normal bones and soft tissues. Visualized paranasal sinuses and mastoids are clear.      Impression:      1.  No acute abnormality.  2.  Multiple nonspecific small foci of FLAIR and T2 hyperintense signal in the periventricular white matter of the cerebrum and bilateral pons in the brainstem. It is mild in the periventricular white matter and moderate in the pons. This is favored to   represent chronic small vessel ischemic change. There is no evidence of acute infarction or acute demyelinating disease.    ReadingStation:[id number]    CT Head WO Contrast [[id number]] Collected: 12/17/21 1138    Order  Status: Completed Updated: 12/17/21 1141    Narrative:      Clinical History:  Dizziness, non-specific  blurry vision in right eye, dizziness  ACR Reason for Exam    Examination:  CT HEAD WO CONTRAST    TECHNIQUE:  5 mm. helical images obtained from  the skull base through the vertex without contrast.  2.5 mm. reconstructed bone algorithm, and 3 mm sagittal and coronal reformatted images provided.     CT images were acquired using Automated Exposure Control for dose reduction.     COMPARISON:   None available    FINDINGS:   CT examination of the brain demonstrates a normal ventricular system. No hemorrhage or cerebral edema is identified. No skull fracture is seen. The cerebellar tonsils lie above the level of the foramen magnum. The examination of the orbits is normal. The   paranasal sinuses, mastoid air cells, and middle ears are clear.      Impression:      Normal CT examination of the brain.    ReadingStation:[id number]             Significant incidental findings: None- [] Responsible party notified  Reviewed and interpreted by the Emergency Physician     EKG:  Last EKG Result       Procedure Component Value Units Date/Time    ECG 12 lead (Stat) [[id number]] Collected: 12/17/21 1256     Updated: 12/18/21 0845     Patient Age 66 years      Patient DOB 05/24/1956     Patient Height --     Patient Weight --     Interpretation Text --     Sinus rhythm  No previous ECG available for comparison  Electronically Signed On 12-18-2021 8:45:38 EDT by Graciela Husbands       Physician Interpreter Graciela Husbands     Ventricular Rate 63 //min      QRS Duration 83 ms      P-R Interval 166 ms      Q-T Interval 415 ms      Q-T Interval(Corrected) 425 ms      P Wave Axis 34 deg      QRS Axis -12 deg      T Axis 37 years             Agree with above interpretation.   Interpreted by the Emergency Physician.      CARDIAC RHYTHM INTERPRETATION from monitor: Sinus rhythm   Interpreted by the Emergency Physician     Vida Rigger, MD  12/19/21 1401

## 2021-12-18 LAB — ECG 12-LEAD
Patient Age: 65 years
Q-T Interval: 415 ms
QRS Duration: 83 ms
Ventricular Rate: 63 //min
